# Patient Record
Sex: Male | Born: 1960 | Race: White | Hispanic: No | Marital: Married | Smoking: Former smoker
Health system: Southern US, Community
[De-identification: ages and names within clinical notes are randomized; demographics above are authoritative.]

## PROBLEM LIST (undated history)

## (undated) DIAGNOSIS — K769 Liver disease, unspecified: Secondary | ICD-10-CM

## (undated) HISTORY — DX: Liver disease, unspecified: K76.9

## (undated) HISTORY — PX: SHOULDER SURGERY: SHX246

---

## 2014-03-17 ENCOUNTER — Encounter: Payer: Self-pay | Admitting: Emergency Medicine

## 2014-03-17 ENCOUNTER — Emergency Department
Admission: EM | Admit: 2014-03-17 | Discharge: 2014-03-17 | Disposition: A | Discharge status: Home / Self Care | Payer: BC Managed Care – PPO | Source: Home / Self Care | Attending: Family Medicine | Admitting: Family Medicine

## 2014-03-17 DIAGNOSIS — J209 Acute bronchitis, unspecified: Secondary | ICD-10-CM

## 2014-03-17 MED ORDER — AZITHROMYCIN 250 MG PO TABS: ORAL_TABLET | ORAL | Status: DC

## 2014-03-17 MED ORDER — BENZONATATE 200 MG PO CAPS: 200.0000 mg | ORAL_CAPSULE | Freq: Every day | ORAL | Status: DC

## 2014-03-17 NOTE — ED Notes (Signed)
Reports 3 day history of cough, congestion and body aches. Took tylenol at 1745.

## 2014-03-17 NOTE — Discharge Instructions (Signed)
Take plain Mucinex (1200 mg guaifenesin) twice daily for cough and congestion.  May add Sudafed for sinus congestion.   Increase fluid intake, rest. May use Afrin nasal spray (or generic oxymetazoline) twice daily for about 5 days.  Also recommend using saline nasal spray several times daily and saline nasal irrigation (AYR is a common brand) Try warm salt water gargles for sore throat.  Stop all antihistamines for now, and other non-prescription cough/cold preparations. Follow-up with family doctor if not improving about one week

## 2014-03-17 NOTE — ED Provider Notes (Signed)
CSN: [id number]     Arrival date & time 03/17/14  1841 History   First MD Initiated Contact with Patient 03/17/14 1941     Chief Complaint  Patient presents with  . Cough  . Nasal Congestion  . Generalized Body Aches      HPI Comments: Patient complains of onset of a nonproductive cough three days ago, worse at night.  He developed fatigue and mild myalgias, with minimal sore throat and nasal congestion.  He had low grade fever this morning.  He is not sure about his last Tdap.  The history is provided by the patient.    History reviewed. No pertinent past medical history. Past Surgical History  Procedure Laterality Date  . Shoulder surgery Left    Family History  Problem Relation Age of Onset  . Diabetes Father   . Stroke Father    History  Substance Use Topics  . Smoking status: Never Smoker   . Smokeless tobacco: Not on file  . Alcohol Use: Yes    Review of Systems Minimal sore throat + cough No pleuritic pain No wheezing Minimal nasal congestion No post-nasal drainage No sinus pain/pressure No itchy/red eyes No earache No hemoptysis No SOB + low grade fever, + chills No nausea No vomiting No abdominal pain No diarrhea No urinary symptoms No skin rash + fatigue + myalgias No headache Used OTC meds without relief  Allergies  Review of patient's allergies indicates no known allergies.  Home Medications   Prior to Admission medications   Medication Sig Start Date End Date Taking? Authorizing Provider  azithromycin (ZITHROMAX Z-PAK) 250 MG tablet Take 2 tabs today; then begin one tab once daily for 4 more days. 03/17/14   Lattie HawStephen A Beese, MD  benzonatate (TESSALON) 200 MG capsule Take 1 capsule (200 mg total) by mouth at bedtime. Take as needed for cough 03/17/14   Lattie HawStephen A Beese, MD   BP 126/75 mmHg  Pulse 92  Temp(Src) 98.5 F (36.9 C) (Oral)  Resp 16  SpO2 97% Physical Exam Nursing notes and Vital Signs reviewed. Appearance:  Patient appears  healthy, stated age, and in no acute distress Eyes:  Pupils are equal, round, and reactive to light and accomodation.  Extraocular movement is intact.  Conjunctivae are not inflamed  Ears:  Canals normal.  Tympanic membranes normal.  Nose:  Mildly congested turbinates.  No sinus tenderness.   Pharynx:  Normal Neck:  Supple.  Tender enlarged posterior nodes are palpated bilaterally  Lungs:   Rhonchi heard left posterior base.  Breath sounds are equal.  Heart:  Regular rate and rhythm without murmurs, rubs, or gallops.  Abdomen:  Nontender without masses or hepatosplenomegaly.  Bowel sounds are present.  No CVA or flank tenderness.  Extremities:  No edema.  No calf tenderness Skin:  No rash present.   ED Course  Procedures  none  MDM   1. Acute bronchitis, unspecified organism    Begin Z-pack for atypical coverage.  Prescription written for Benzonatate [facility](Tessalon) to take at bedtime for night-time cough.  Take plain Mucinex (1200 mg guaifenesin) twice daily for cough and congestion.  May add Sudafed for sinus congestion.   Increase fluid intake, rest. May use Afrin nasal spray (or generic oxymetazoline) twice daily for about 5 days.  Also recommend using saline nasal spray several times daily and saline nasal irrigation (AYR is a common brand) Try warm salt water gargles for sore throat.  Stop all antihistamines for now, and other non-prescription  cough/cold preparations. Follow-up with family doctor if not improving about one week    Lattie HawStephen A Beese, MD 03/19/14 [phone]

## 2014-03-20 ENCOUNTER — Telehealth: Payer: Self-pay | Admitting: Emergency Medicine

## 2014-03-20 NOTE — ED Notes (Signed)
Inquired about patient's status; encourage them to call with questions/concerns.  

## 2015-04-08 DIAGNOSIS — K769 Liver disease, unspecified: Secondary | ICD-10-CM

## 2015-04-08 HISTORY — DX: Liver disease, unspecified: K76.9

## 2015-07-13 ENCOUNTER — Other Ambulatory Visit: Payer: Self-pay | Admitting: Family Medicine

## 2015-07-13 DIAGNOSIS — R7989 Other specified abnormal findings of blood chemistry: Secondary | ICD-10-CM

## 2015-07-13 DIAGNOSIS — R945 Abnormal results of liver function studies: Principal | ICD-10-CM

## 2015-07-16 ENCOUNTER — Ambulatory Visit (INDEPENDENT_AMBULATORY_CARE_PROVIDER_SITE_OTHER): Payer: BLUE CROSS/BLUE SHIELD

## 2015-07-16 DIAGNOSIS — R945 Abnormal results of liver function studies: Principal | ICD-10-CM

## 2015-07-16 DIAGNOSIS — R7989 Other specified abnormal findings of blood chemistry: Secondary | ICD-10-CM | POA: Diagnosis not present

## 2015-07-18 ENCOUNTER — Other Ambulatory Visit: Payer: Self-pay | Admitting: Family Medicine

## 2015-07-18 DIAGNOSIS — R7989 Other specified abnormal findings of blood chemistry: Secondary | ICD-10-CM

## 2015-07-18 DIAGNOSIS — R945 Abnormal results of liver function studies: Principal | ICD-10-CM

## 2015-07-23 ENCOUNTER — Ambulatory Visit (INDEPENDENT_AMBULATORY_CARE_PROVIDER_SITE_OTHER): Payer: BLUE CROSS/BLUE SHIELD

## 2015-07-23 DIAGNOSIS — R7989 Other specified abnormal findings of blood chemistry: Secondary | ICD-10-CM | POA: Diagnosis not present

## 2015-07-23 DIAGNOSIS — R945 Abnormal results of liver function studies: Principal | ICD-10-CM

## 2015-07-23 MED ORDER — GADOBENATE DIMEGLUMINE 529 MG/ML IV SOLN
20.0000 mL | Freq: Once | INTRAVENOUS | Status: AC | PRN
  Administered 2015-07-23: 16 mL via INTRAVENOUS

## 2018-01-02 DIAGNOSIS — Z23 Encounter for immunization: Secondary | ICD-10-CM | POA: Diagnosis not present

## 2018-01-08 DIAGNOSIS — R410 Disorientation, unspecified: Secondary | ICD-10-CM | POA: Diagnosis not present

## 2018-01-08 DIAGNOSIS — F99 Mental disorder, not otherwise specified: Secondary | ICD-10-CM | POA: Diagnosis not present

## 2018-01-11 ENCOUNTER — Other Ambulatory Visit: Payer: Self-pay | Admitting: Physician Assistant

## 2018-01-11 ENCOUNTER — Other Ambulatory Visit: Payer: BLUE CROSS/BLUE SHIELD

## 2018-01-11 ENCOUNTER — Ambulatory Visit (INDEPENDENT_AMBULATORY_CARE_PROVIDER_SITE_OTHER): Payer: BLUE CROSS/BLUE SHIELD

## 2018-01-11 DIAGNOSIS — G319 Degenerative disease of nervous system, unspecified: Secondary | ICD-10-CM | POA: Diagnosis not present

## 2018-01-11 DIAGNOSIS — J322 Chronic ethmoidal sinusitis: Secondary | ICD-10-CM

## 2018-01-11 DIAGNOSIS — F99 Mental disorder, not otherwise specified: Secondary | ICD-10-CM

## 2018-01-11 DIAGNOSIS — Z8659 Personal history of other mental and behavioral disorders: Secondary | ICD-10-CM | POA: Diagnosis not present

## 2018-01-28 ENCOUNTER — Ambulatory Visit: Payer: BLUE CROSS/BLUE SHIELD | Admitting: Neurology

## 2018-01-28 ENCOUNTER — Telehealth: Payer: Self-pay | Admitting: Neurology

## 2018-01-28 ENCOUNTER — Encounter: Payer: Self-pay | Admitting: Neurology

## 2018-01-28 VITALS — BP 132/85 | HR 77 | Ht 69.0 in | Wt 215.0 lb

## 2018-01-28 DIAGNOSIS — F0391 Unspecified dementia with behavioral disturbance: Secondary | ICD-10-CM | POA: Diagnosis not present

## 2018-01-28 DIAGNOSIS — R419 Unspecified symptoms and signs involving cognitive functions and awareness: Secondary | ICD-10-CM | POA: Diagnosis not present

## 2018-01-28 DIAGNOSIS — F0390 Unspecified dementia without behavioral disturbance: Secondary | ICD-10-CM | POA: Diagnosis not present

## 2018-01-28 DIAGNOSIS — G9340 Encephalopathy, unspecified: Secondary | ICD-10-CM

## 2018-01-28 DIAGNOSIS — E539 Vitamin B deficiency, unspecified: Secondary | ICD-10-CM | POA: Diagnosis not present

## 2018-01-28 DIAGNOSIS — E519 Thiamine deficiency, unspecified: Secondary | ICD-10-CM | POA: Diagnosis not present

## 2018-01-28 NOTE — Patient Instructions (Addendum)
Labwork MRI brain w/wo contrast [facility]) FDG PET Scan [facility]ore Long for dementia FTD vs Alzheimers) Formal neurocognitive testing, contact physician at [facility] No Driving

## 2018-01-28 NOTE — Telephone Encounter (Signed)
Pt was called by front staff.

## 2018-01-28 NOTE — Progress Notes (Signed)
[facility]    Provider:  Dr Logan Nichols Referring Provider: Joycelyn Rua, MD, Logan Nichols Primary Care Physician:  Logan Rua, MD  CC:  Cognitive changes  HPI:  Logan Nichols is a 57 y.o. male here as requested by Dr. Lenise Nichols for memory loss.  Past medical history obesity, prediabetes, IBS.  Judgment and insight appeared normal. 2 years ago but he had an issue with augmentin and became jaundiced with hepatitis, this caused a hospitalization and afterwards he had psychiatric issues, insomnia, depression, out of work for several months, started April 1st 2017. That took an emotional toll and physical toll. Wife and son provides most information today. Never went back to baseline afterwards. In the past 4-5 months there has been personality problems, memory loss, changes in concentration, not remembering conversations, asking the same questions over and over again in the same day, a lot of short term memory, also long-term memory, have lived in the same house for 30 years and can't remember how to get to places he has been to multiple times. No tremors, no focal weakness, no facial droop, he has difficulty speaking and finding words, forgets and repeats words and sentences, jumbling words. Not motivated possible apathy, he was given a list of tasks the other day and did not do many of then any of them right, inability to plan and execute. They deny extensive alcohol or drug use or mood disorders. No other focal neurologic deficits, associated symptoms, inciting events or modifiable factors.  Reviewed notes, labs and imaging from outside physicians, which showed:  Hemoglobin A1c 5.4, CMP with BUN 11 and creatinine 1.08 (these labs were taken January 12, 2018), otherwise CMP unremarkable, sed rate normal 7, TSH normal 1.4.ANA negative, CRP 2 which is normal.  CT of the head 01/11/2018: personally reviewed imaging and agree with the following:  Mild diffuse atrophy. Brain  parenchyma appears unremarkable. No acute infarct. No mass or hemorrhage.  Reviewed Dr. Izola Nichols notes, she is concerned over memory loss, does not suspect alcohol or drugs as cause.  Head CT was ordered.  MRI/MRA was ordered and sent to neurology.  He has had a 4 to 14-month change in cognition and memory.  Presented with his wife and HR director from work.  The HR director for [employer] had a list of instances where Logan Nichols seemed like something was wrong.  States he will ask about a project several times as if it were the first time.  He forgot his log and username for Crohn O's and was unable to figure out how to open [employer].  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  She has had concerns mother plays about his behavior as well.  States that the changes in his demeanor seems slow but have definitely been worsening.  His mood has changed at home 2.  She finds her self reminding him of things like a hospital visit that occurred from a reaction to Augmentin last year, he stated he was out of work for 8 months but she said it was only 3 months.  Patient stays mostly quiet.  He still performs his ADLs. No hallucinations, no delusions. Discussed no driving.   Review of Systems: Patient complains of symptoms per HPI as well as the following symptoms: memory loss, personality changes, confusion. Pertinent negatives and positives per HPI. All others negative.   Social History   Socioeconomic History  . Marital status: Married    Spouse name: Not on  file  . Number of children: 1  . Years of education: 41  . Highest education level: Bachelor's degree (e.g., BA, AB, BS)  Occupational History  . Not on file  Social Needs  . Financial resource strain: Not on file  . Food insecurity:    Worry: Not on file    Inability: Not on file  . Transportation needs:    Medical: Not on file    Non-medical: Not on file  Tobacco Use  . Smoking status: Former Smoker    Types: Cigarettes     Last attempt to quit: 1989    Years since quitting: 30.8  . Smokeless tobacco: Never Used  Substance and Sexual Activity  . Alcohol use: Yes    Comment: occasionally  . Drug use: Never  . Sexual activity: Not on file  Lifestyle  . Physical activity:    Days per week: Not on file    Minutes per session: Not on file  . Stress: Not on file  Relationships  . Social connections:    Talks on phone: Not on file    Gets together: Not on file    Attends religious service: Not on file    Active member of club or organization: Not on file    Attends meetings of clubs or organizations: Not on file    Relationship status: Not on file  . Intimate partner violence:    Fear of current or ex partner: Not on file    Emotionally abused: Not on file    Physically abused: Not on file    Forced sexual activity: Not on file  Other Topics Concern  . Not on file  Social History Narrative   Lives at home with spouse    Family History  Problem Relation Age of Onset  . Diabetes Father   . Stroke Father   . Cataracts Father   . CAD Father   . Other Father        "plaque issue"  . Lupus Mother     Past Medical History:  Diagnosis Date  . 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 SHOULDER SURGERY Left     No current outpatient medications on file.   No current facility-administered medications for this visit.     Allergies as of 01/28/2018 - Review Complete 01/28/2018  Allergen Reaction Noted  . Amoxicillin-pot clavulanate  08/02/2015  . Other  01/28/2018    Vitals: BP 132/85 (BP Location: Right Arm, Patient Position: Sitting)   Pulse 77   Ht 5\' 9"  (1.753 m)   Wt 215 lb (97.5 kg)   BMI 31.75 kg/m  Last Weight:  Wt Readings from Last 1 Encounters:  01/28/18 215 lb (97.5 kg)   Last Height:   Ht Readings from Last 1 Encounters:  01/28/18 5\' 9"  (1.753 m)    Physical exam: Exam: Gen: flat affect, not conversant                  CV: RRR, no MRG. No Carotid Bruits. No peripheral edema, warm, nontender Eyes: Conjunctivae clear without exudates or hemorrhage  Neuro: Detailed Neurologic Exam  Speech:    Speech is without aphasia Cognition:  MMSE - Mini Mental State Exam 01/28/2018  Orientation to time 2  Orientation to Place 4  Registration 3  Attention/ Calculation 1  Recall 1  Language- name 2 objects 2  Language- repeat 0  Language- follow  3 step command 3  Language- read & follow direction 1  Write a sentence 0  Copy design 0  Total score 17    Cranial Nerves:    The pupils are equal, round, and reactive to light. Attempted fundoscopy could not visualize.. Visual fields are full to finger confrontation. Extraocular movements are intact. Trigeminal sensation is intact and the muscles of mastication are normal. The face is symmetric. The palate elevates in the midline. Hearing intact. Voice is normal. Shoulder shrug is normal. The tongue has normal motion without fasciculations.   Coordination:    No dysmetria noted  Gait:    Heel-toe and tandem gait are normal. Slightly stooped.   Motor Observation:    No asymmetry, no atrophy, and no involuntary movements noted. Tone:    Normal muscle tone.    Posture:    Posture is normal. normal erect    Strength:    Strength is V/V in the upper and lower limbs.      Sensation: intact to LT     Reflex Exam:  DTR's:    Deep tendon reflexes in the upper and lower extremities are brisk bilaterally.   Toes:    The toes are equiv bilaterally.   Clonus:    Clonus is present in the patellars, crossed adductors.    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 In the last 4-5 months progressive memory loss, more short term memory loss, changes in personality and concentration,  difficultly performing tasks, getting lost in the car, decreased motivation, inability to plan and execute. Very concerning for Frontotemporal dementia however cannot rule out other dementias such as Alzheimer's early onset.  +upper motor neuron signs.   Extensive Labwork today. Consider LP in future. MRI brain w/wo contrast [facility]) to eval for other causes of dementia FDG PET Scan Ger[facility] Spore Long for dementia FTD vs Alzheimers) Formal neurocognitive testing, contact physician at [facility] No Driving and discussed safety precautions  Orders Placed This Encounter  Procedures  . Culture, Urine  . MR BRAIN W WO CONTRAST  . NM PET Metabolic Brain  . Ammonia  . ANA  . ANA, IFA (with reflex)  . Comprehensive Drug Analysis,Ur  . Urinalysis, Routine w reflex microscopic  . CBC  . Comprehensive metabolic panel  . Homocysteine  . Heavy metals, blood  . B. burgdorfi Antibody  . RPR  . HIV Antibody (routine testing w rflx)  . Magnesium  . Phosphorus  . Sedimentation rate  . C-reactive protein  . TSH  . T4, Free  . Vitamin B6  . Vitamin B1  . B12 and Folate Panel  . Methylmalonic acid, serum  . PTH, Intact and Calcium  . Thyroglobulin antibody  . Thyroid peroxidase antibody  . Ambulatory referral to Neuropsychology     Cc: Logan Rua, MD, Logan Nichols    Naomie Dean, MD  [facility] [address] [place], [place] [address]  Phone [phone] Fax [phone]

## 2018-01-28 NOTE — Telephone Encounter (Signed)
Called pt. And spoke with wife. Scheduled appt time that would better fit their schedule.

## 2018-01-28 NOTE — Telephone Encounter (Signed)
Pt wife(Mccorvey,Beverly-not showing to be on a DPR yet ) has called stating she got a call from RN Toma Copier about a 3:30 office visit on 03-16-2018 pt wife is asking for a call back about it.  She declined the office visit for 12-11 with 8:00 check in

## 2018-01-29 ENCOUNTER — Encounter: Payer: Self-pay | Admitting: Neurology

## 2018-01-29 DIAGNOSIS — F0390 Unspecified dementia without behavioral disturbance: Secondary | ICD-10-CM | POA: Insufficient documentation

## 2018-01-30 LAB — URINE CULTURE: Organism ID, Bacteria: NO GROWTH

## 2018-02-01 LAB — COMPREHENSIVE DRUG ANALYSIS,UR

## 2018-02-02 ENCOUNTER — Telehealth: Payer: Self-pay | Admitting: *Deleted

## 2018-02-02 NOTE — Telephone Encounter (Signed)
Pts wife [name] returning RNs call

## 2018-02-02 NOTE — Telephone Encounter (Signed)
Spoke with pt's wife [name] (on [place]) and discussed that so far labs are negative, some pending. We will call if those are abnormal. She verbalized understanding and appreciation. She asked about the status on the authorization for the MRI and PET scans. RN advised that it does not look like the process is complete but will send message to MRI coordinator to check. She verbalized appreciation.

## 2018-02-02 NOTE — Telephone Encounter (Signed)
[employer] Auth: [id number] (exp. 02/02/18 to 03/03/18) faxed order to [facility] med center they will reach out to the patient to schedule.

## 2018-02-02 NOTE — Telephone Encounter (Signed)
-----   Message from Anson Fret, MD sent at 01/31/2018  2:44 PM EDT ----- Labs all negative so far. Still pending several will call if abnormal.

## 2018-02-02 NOTE — Telephone Encounter (Signed)
Thank you so much

## 2018-02-03 ENCOUNTER — Telehealth: Payer: Self-pay | Admitting: Neurology

## 2018-02-03 LAB — COMPREHENSIVE METABOLIC PANEL
A/G RATIO: 1.7 (ref 1.2–2.2)
ALBUMIN: 4.7 g/dL (ref 3.5–5.5)
ALK PHOS: 62 IU/L (ref 39–117)
ALT: 16 IU/L (ref 0–44)
AST: 17 IU/L (ref 0–40)
BUN / CREAT RATIO: 11 (ref 9–20)
BUN: 12 mg/dL (ref 6–24)
Bilirubin Total: 0.6 mg/dL (ref 0.0–1.2)
CALCIUM: 10 mg/dL (ref 8.7–10.2)
CO2: 24 mmol/L (ref 20–29)
Chloride: 105 mmol/L (ref 96–106)
Creatinine, Ser: 1.11 mg/dL (ref 0.76–1.27)
GFR calc Af Amer: 85 mL/min/{1.73_m2} (ref >59)
GFR calc non Af Amer: 74 mL/min/{1.73_m2} (ref >59)
Globulin, Total: 2.7 g/dL (ref 1.5–4.5)
Glucose: 90 mg/dL (ref 65–99)
POTASSIUM: 4.8 mmol/L (ref 3.5–5.2)
Sodium: 145 mmol/L — ABNORMAL HIGH (ref 134–144)
Total Protein: 7.4 g/dL (ref 6.0–8.5)

## 2018-02-03 LAB — URINALYSIS, ROUTINE W REFLEX MICROSCOPIC
Bilirubin, UA: NEGATIVE
Glucose, UA: NEGATIVE
Ketones, UA: NEGATIVE
LEUKOCYTES UA: NEGATIVE
Nitrite, UA: NEGATIVE
Protein, UA: NEGATIVE
RBC, UA: NEGATIVE
Specific Gravity, UA: 1.009 (ref 1.005–1.030)
Urobilinogen, Ur: 0.2 mg/dL (ref 0.2–1.0)
pH, UA: 7 (ref 5.0–7.5)

## 2018-02-03 LAB — CBC
HEMATOCRIT: 45.1 % (ref 37.5–51.0)
Hemoglobin: 15.2 g/dL (ref 13.0–17.7)
MCH: 29.7 pg (ref 26.6–33.0)
MCHC: 33.7 g/dL (ref 31.5–35.7)
MCV: 88 fL (ref 79–97)
Platelets: 255 10*3/uL (ref 150–450)
RBC: 5.12 x10E6/uL (ref 4.14–5.80)
RDW: 12.9 % (ref 12.3–15.4)
WBC: 7.3 10*3/uL (ref 3.4–10.8)

## 2018-02-03 LAB — SEDIMENTATION RATE: SED RATE: 2 mm/h (ref 0–30)

## 2018-02-03 LAB — MAGNESIUM: MAGNESIUM: 2.1 mg/dL (ref 1.6–2.3)

## 2018-02-03 LAB — PHOSPHORUS: Phosphorus: 3.7 mg/dL (ref 2.5–4.5)

## 2018-02-03 LAB — B12 AND FOLATE PANEL
Folate: 15.1 ng/mL (ref >3.0)
VITAMIN B 12: 662 pg/mL (ref 232–1245)

## 2018-02-03 LAB — PTH, INTACT AND CALCIUM: PTH: 15 pg/mL (ref 15–65)

## 2018-02-03 LAB — TSH: TSH: 1.03 u[IU]/mL (ref 0.450–4.500)

## 2018-02-03 LAB — METHYLMALONIC ACID, SERUM: Methylmalonic Acid: 180 nmol/L (ref 0–378)

## 2018-02-03 LAB — THYROID PEROXIDASE ANTIBODY: THYROID PEROXIDASE ANTIBODY: 20 [IU]/mL (ref 0–34)

## 2018-02-03 LAB — ANA: ANA TITER 1: NEGATIVE

## 2018-02-03 LAB — T4, FREE: Free T4: 1.35 ng/dL (ref 0.82–1.77)

## 2018-02-03 LAB — RPR: RPR: NONREACTIVE

## 2018-02-03 LAB — THYROGLOBULIN ANTIBODY

## 2018-02-03 LAB — VITAMIN B1: THIAMINE: 133.4 nmol/L (ref 66.5–200.0)

## 2018-02-03 LAB — HEAVY METALS, BLOOD
Arsenic: 5 ug/L (ref 2–23)
Lead, Blood: NOT DETECTED ug/dL (ref 0–4)
Mercury: NOT DETECTED ug/L (ref 0.0–14.9)

## 2018-02-03 LAB — AMMONIA: AMMONIA: 29 ug/dL (ref 27–102)

## 2018-02-03 LAB — C-REACTIVE PROTEIN: CRP: 1 mg/L (ref 0–10)

## 2018-02-03 LAB — HIV ANTIBODY (ROUTINE TESTING W REFLEX): HIV Screen 4th Generation wRfx: NONREACTIVE

## 2018-02-03 LAB — VITAMIN B6

## 2018-02-03 LAB — HOMOCYSTEINE: Homocysteine: 13.5 umol/L (ref 0.0–15.0)

## 2018-02-03 LAB — B. BURGDORFI ANTIBODIES: Lyme IgG/IgM Ab: <0.91 {ISR} (ref 0.00–0.90)

## 2018-02-03 NOTE — Telephone Encounter (Signed)
Noted thank you

## 2018-02-03 NOTE — Telephone Encounter (Signed)
Per Dr. Lucia Gaskins, wrong name was given. It is supposed to be Logan Nichols. Lajean Silvius, MD, MPH Assistant Professor of Neurology Cognitive and Behavioral Neurology. Dr. Lucia Gaskins already talked to [name] this morning and they will have the referral sent to Dr. Lajean Silvius.  Called pt's wife [name] and LVM asking for call back. Left office number in message. When she calls back, please give her the message above from Dr. Lucia Gaskins.

## 2018-02-03 NOTE — Telephone Encounter (Signed)
Pt wife(on DPR-[place],Beverly) has asked for a call back re: suggested referral to see a Scarlett Presto.  Please call

## 2018-02-03 NOTE — Telephone Encounter (Signed)
Called pt & pt's wife on home # and LVM (ok per DPR) informing them that [facility] med center will be calling them to schedule the MRI. Our office will not be scheduling the MRI. Left office number in case they have any questions.

## 2018-02-04 NOTE — Telephone Encounter (Signed)
Spoke with pt's wife [place]. Advised her of the information from the referral:   Patient will see Dr. Vaughan Basta Dr. Rennie Plowman is booked until March of 2020 Telephone [phone] fax [phone] -- Jan 3 rd arrive at 8:30 for 9:00 am .   She verbalized understanding and appreciation for the update. She was hoping pt could be seen sooner since he is out of work for 2 months. She will call their office to see if this is possible.

## 2018-02-04 NOTE — Telephone Encounter (Signed)
[facility] is scheduled at [facility] [place] with [facility] for 02/15/18.

## 2018-02-11 ENCOUNTER — Ambulatory Visit (HOSPITAL_COMMUNITY)
Admission: RE | Admit: 2018-02-11 | Discharge: 2018-02-11 | Disposition: A | Discharge status: Home / Self Care | Payer: BLUE CROSS/BLUE SHIELD | Source: Ambulatory Visit | Attending: Neurology | Admitting: Neurology

## 2018-02-11 DIAGNOSIS — F0391 Unspecified dementia with behavioral disturbance: Secondary | ICD-10-CM | POA: Insufficient documentation

## 2018-02-11 DIAGNOSIS — F0390 Unspecified dementia without behavioral disturbance: Secondary | ICD-10-CM | POA: Insufficient documentation

## 2018-02-11 DIAGNOSIS — G9340 Encephalopathy, unspecified: Secondary | ICD-10-CM

## 2018-02-11 DIAGNOSIS — R4182 Altered mental status, unspecified: Secondary | ICD-10-CM | POA: Diagnosis not present

## 2018-02-11 LAB — GLUCOSE, CAPILLARY: Glucose-Capillary: 92 mg/dL (ref 70–99)

## 2018-02-11 MED ORDER — FLUDEOXYGLUCOSE F - 18 (FDG) INJECTION
10.1600 | Freq: Once | INTRAVENOUS | Status: AC
  Administered 2018-02-11: 10.16 via INTRAVENOUS

## 2018-02-12 ENCOUNTER — Telehealth: Payer: Self-pay | Admitting: *Deleted

## 2018-02-12 NOTE — Telephone Encounter (Signed)
-----   Message from Anson Fret, MD sent at 02/11/2018  7:44 PM EST ----- FDg PET Scan is normal, no indication of Alzheimers or frontotemporal dementia. However the patient could still have these as the test is not 100% acurate. The next step is to get him to [facility] to the memory unit we discussed. Thanks "No decreased relative cortical metabolism to suggest frontotemporal dementia or Alzheimer's type pathology."

## 2018-02-12 NOTE — Telephone Encounter (Signed)
Called pt's wife [name] (on [place]) and LVM asking for call back. Left office number and hours in message.

## 2018-02-12 NOTE — Telephone Encounter (Signed)
Spoke with [name] (pt's wife) and she stated that since Dr. Lajean Silvius was booked until the end of March, they scheudled pt with Chauncey Cruel off [address] and pt's appt is next Tuesday at 1:30 pm. She thinks he might be general neurology and not one who does the formal memory testing. She said they didn't schedule pt with Dr. Lajean Silvius. She asked if all of the lab results, FDG pet scan, and CT results could be sent to [facility] office @ fax # [phone]. RN advised that she would send message to referrals and Dr. Lucia Gaskins to let them know.  RN reviewed FDG pet scan results and that pt may still have these conditions but we need him to see [facility] as planned. She verbalized understanding and appreciation.

## 2018-02-12 NOTE — Telephone Encounter (Signed)
I don;t know who that is but if it is a doctor at [facility] I would see them and then they can try to talk to [facility] and get him moved up more quickly. thanks

## 2018-02-12 NOTE — Telephone Encounter (Signed)
Called pt's wife [name] and LVM asking for call back.

## 2018-02-15 ENCOUNTER — Ambulatory Visit (INDEPENDENT_AMBULATORY_CARE_PROVIDER_SITE_OTHER): Payer: BLUE CROSS/BLUE SHIELD

## 2018-02-15 DIAGNOSIS — F0390 Unspecified dementia without behavioral disturbance: Secondary | ICD-10-CM

## 2018-02-15 DIAGNOSIS — R410 Disorientation, unspecified: Secondary | ICD-10-CM

## 2018-02-15 DIAGNOSIS — G9340 Encephalopathy, unspecified: Secondary | ICD-10-CM

## 2018-02-15 DIAGNOSIS — R479 Unspecified speech disturbances: Secondary | ICD-10-CM

## 2018-02-15 DIAGNOSIS — F0391 Unspecified dementia with behavioral disturbance: Secondary | ICD-10-CM

## 2018-02-15 MED ORDER — GADOBUTROL 1 MMOL/ML IV SOLN
10.0000 mL | Freq: Once | INTRAVENOUS | Status: AC | PRN
  Administered 2018-02-15: 10 mL via INTRAVENOUS

## 2018-02-15 NOTE — Telephone Encounter (Signed)
Noted thank you

## 2018-02-15 NOTE — Telephone Encounter (Signed)
Called and spoke to Patient's wife and she relayed they set up apt.  Dr. Gerarda Gunther. Marcello Moores, MD  - [phone] - fax [phone] . I have faxed all records and she will take MRI CD as well .  Patient is going to Keep his apt apt with [facility] as well.

## 2018-02-16 ENCOUNTER — Telehealth: Payer: Self-pay | Admitting: Neurology

## 2018-02-16 DIAGNOSIS — R413 Other amnesia: Secondary | ICD-10-CM | POA: Diagnosis not present

## 2018-02-16 DIAGNOSIS — R404 Transient alteration of awareness: Secondary | ICD-10-CM | POA: Diagnosis not present

## 2018-02-16 NOTE — Telephone Encounter (Signed)
Patient's wife [name] (on [place]) states patient had an MRI yesterday. She wants to know if we would have results today because he has a 1:30pm appointment today with Dr. Chauncey Cruel and would like results sent to him.

## 2018-02-16 NOTE — Telephone Encounter (Signed)
MRI of the brain is unremarkable for age. If they agree I would like to do a DAT scan to look for Lewy Body Dementia. But since they have an appointment soon with the neuropsychologist we can await and see their results first before more testing. Let me know how they feel thanks.

## 2018-02-16 NOTE — Telephone Encounter (Signed)
MRI was unremarkable. Please fax results. I'd like to order another scan a DAT scan to evaluate for lewy body dementia. Please get the name of the neuropsych specialist and nuber so I can call him after they see him thanks

## 2018-02-17 NOTE — Telephone Encounter (Signed)
Called wife Meriam SpragueBeverly on her mobile #. LVM asking for call back when she can. Left office number in message.

## 2018-02-17 NOTE — Telephone Encounter (Signed)
Reached out to wife. Pt answered. Wife is at work. Will try her mobile number today.

## 2018-02-17 NOTE — Telephone Encounter (Addendum)
Plan from Dr. Rinaldo RatelMitchell Isaac's consult note on 02/16/18:  Plan   He presents with rapidly progressive memory loss which is certainly concerning for a neurodegenerative process. I will get an EEG to rule out seizures, metabolic induced slowing or even periodic complexes to suggest CJD. I will send off a vitamin B1 as this has not been done and also a full paraneoplastic autoantibody evaluation. I will try to get a stat referral for neuropsych testing to help better characterize his memory loss. We did discuss a spinal tap. If we did this I would have to try to work him in anyway so we will plan to be in touch after his initial test results. Should they come back unrevealing then a spinal tap would likely be indicated. In that case I would work to get him in quickly.   RN noted that pt has an upcoming appointment with Dr. Vaughan BastaBenjamin Williams on 04/09/2018.

## 2018-02-17 NOTE — Telephone Encounter (Signed)
Will address this in other phone note labeled results.

## 2018-02-17 NOTE — Telephone Encounter (Signed)
Received from Dr. Lucia GaskinsAhern:  MRI of the brain is unremarkable for age. If they agree I would like to do a DAT scan to look for Lewy Body Dementia. But since they have an appointment soon with the neuropsychologist we can await and see their results first before more testing. Let me know how they feel thanks.

## 2018-02-17 NOTE — Telephone Encounter (Signed)
It may take 4-6 weeks for the neuropsych testing report to be completed I'm not sure since Dr. Marcello MooresIsaac ordered it. Also after the report is complete they need to meet with the neuropsychologist or Dr. Marcello MooresIsaac to review the report, Cancel with me until they complete the process with Dr. Marcello MooresIsaac and they may want to stay with Dr. Adrian [facility]rowsIsaac [facility].

## 2018-02-17 NOTE — Telephone Encounter (Signed)
Meriam SpragueBeverly returned my call. She stated that Dr. Marcello MooresIsaac had told her the MRI brain really didn't show anything and RN confirmed and told her that Dr.Ahern said it was unremarkable for pt's age. Discussed that Dr. Lucia GaskinsAhern would like to do a DAT scan to look for Lewy Body Dementia and that if Meriam SpragueBeverly wants, she can wait until after the neuropsych testing since it's soon, but we would like to know how she feels. Meriam SpragueBeverly agreed and would like to wait. She [facility] Dr. Gennie AlmaGoeke at 10:00 AM on 03/16/18 and told her to cancel the appointment with Dr. Vaughan BastaBenjamin Williams that was scheduled for January. Pt also has a f/u with Dr. Lucia GaskinsAhern at 3:30 pm on 03/16/18 as well. Meriam SpragueBeverly asked if that is ok. RN informed her that she would let Dr. Lucia GaskinsAhern know and if anything needs to change we will call her back. She verbalized appreciation. She will cancel appt with Dr. Mayford KnifeWilliams as wake advised.

## 2018-02-18 NOTE — Telephone Encounter (Signed)
Called pt's wife & LVM asking for call back. Left office number in message.

## 2018-02-18 NOTE — Telephone Encounter (Signed)
Pt wife(on DPR) has returned call to [employer], she s asking for a a call back either before 3:30 or after 4:30

## 2018-02-18 NOTE — Telephone Encounter (Signed)
Spoke with pt's wife FritchBeverly. Discussed advice from Dr. Lucia GaskinsAhern regarding completing the full neuropsych testing first as this could take 4-6 weeks and f/u with neuropsych or Dr. Marcello MooresIsaac to discuss results. They could even stay with Dr. Marcello MooresIsaac if they wanted afterward so we can continue to keep in touch and discuss later. Discussed canceling the 03/16/18 appt. Meriam SpragueBeverly was completely fine with this and verbalized appreciation. Her questions were answered.   03/16/18 appt with Dr. Lucia GaskinsAhern canceled.

## 2018-02-22 ENCOUNTER — Encounter

## 2018-02-22 ENCOUNTER — Ambulatory Visit: Payer: BLUE CROSS/BLUE SHIELD | Admitting: Neurology

## 2018-03-08 DIAGNOSIS — R413 Other amnesia: Secondary | ICD-10-CM | POA: Diagnosis not present

## 2018-03-09 DIAGNOSIS — R413 Other amnesia: Secondary | ICD-10-CM | POA: Diagnosis not present

## 2018-03-16 ENCOUNTER — Ambulatory Visit: Payer: BLUE CROSS/BLUE SHIELD | Admitting: Neurology

## 2018-03-16 DIAGNOSIS — R4189 Other symptoms and signs involving cognitive functions and awareness: Secondary | ICD-10-CM | POA: Diagnosis not present

## 2018-03-25 ENCOUNTER — Ambulatory Visit: Payer: BLUE CROSS/BLUE SHIELD | Admitting: Neurology

## 2018-04-14 DIAGNOSIS — F0391 Unspecified dementia with behavioral disturbance: Secondary | ICD-10-CM | POA: Diagnosis not present

## 2018-04-17 DIAGNOSIS — R358 Other polyuria: Secondary | ICD-10-CM | POA: Diagnosis not present

## 2018-04-17 DIAGNOSIS — R350 Frequency of micturition: Secondary | ICD-10-CM | POA: Diagnosis not present

## 2018-04-21 DIAGNOSIS — K719 Toxic liver disease, unspecified: Secondary | ICD-10-CM | POA: Diagnosis not present

## 2018-04-21 DIAGNOSIS — R945 Abnormal results of liver function studies: Secondary | ICD-10-CM | POA: Diagnosis not present

## 2018-04-21 DIAGNOSIS — E669 Obesity, unspecified: Secondary | ICD-10-CM | POA: Diagnosis not present

## 2018-04-21 DIAGNOSIS — Z6830 Body mass index (BMI) 30.0-30.9, adult: Secondary | ICD-10-CM | POA: Diagnosis not present

## 2018-04-21 DIAGNOSIS — K7589 Other specified inflammatory liver diseases: Secondary | ICD-10-CM | POA: Diagnosis not present

## 2018-04-27 DIAGNOSIS — F0150 Vascular dementia without behavioral disturbance: Secondary | ICD-10-CM | POA: Diagnosis not present

## 2018-05-12 DIAGNOSIS — R110 Nausea: Secondary | ICD-10-CM | POA: Diagnosis not present

## 2018-05-12 DIAGNOSIS — R4182 Altered mental status, unspecified: Secondary | ICD-10-CM | POA: Diagnosis not present

## 2018-05-12 DIAGNOSIS — R55 Syncope and collapse: Secondary | ICD-10-CM | POA: Diagnosis not present

## 2018-05-12 DIAGNOSIS — R4020 Unspecified coma: Secondary | ICD-10-CM | POA: Diagnosis not present

## 2018-05-13 DIAGNOSIS — R55 Syncope and collapse: Secondary | ICD-10-CM | POA: Diagnosis not present

## 2018-05-17 DIAGNOSIS — Z0000 Encounter for general adult medical examination without abnormal findings: Secondary | ICD-10-CM | POA: Diagnosis not present

## 2018-05-17 DIAGNOSIS — R809 Proteinuria, unspecified: Secondary | ICD-10-CM | POA: Diagnosis not present

## 2018-05-24 DIAGNOSIS — K719 Toxic liver disease, unspecified: Secondary | ICD-10-CM | POA: Diagnosis not present

## 2018-05-24 DIAGNOSIS — F329 Major depressive disorder, single episode, unspecified: Secondary | ICD-10-CM | POA: Diagnosis not present

## 2018-05-24 DIAGNOSIS — F0390 Unspecified dementia without behavioral disturbance: Secondary | ICD-10-CM | POA: Diagnosis not present

## 2018-05-24 DIAGNOSIS — T368X5D Adverse effect of other systemic antibiotics, subsequent encounter: Secondary | ICD-10-CM | POA: Diagnosis not present

## 2018-05-24 DIAGNOSIS — G3183 Dementia with Lewy bodies: Secondary | ICD-10-CM | POA: Diagnosis not present

## 2018-07-19 DIAGNOSIS — H01009 Unspecified blepharitis unspecified eye, unspecified eyelid: Secondary | ICD-10-CM | POA: Diagnosis not present

## 2018-07-30 DIAGNOSIS — L249 Irritant contact dermatitis, unspecified cause: Secondary | ICD-10-CM | POA: Diagnosis not present

## 2018-08-24 DIAGNOSIS — R21 Rash and other nonspecific skin eruption: Secondary | ICD-10-CM | POA: Diagnosis not present

## 2018-08-24 DIAGNOSIS — L218 Other seborrheic dermatitis: Secondary | ICD-10-CM | POA: Diagnosis not present

## 2018-08-25 DIAGNOSIS — G3183 Dementia with Lewy bodies: Secondary | ICD-10-CM | POA: Diagnosis not present

## 2018-08-25 DIAGNOSIS — F0280 Dementia in other diseases classified elsewhere without behavioral disturbance: Secondary | ICD-10-CM | POA: Diagnosis not present

## 2018-09-01 DIAGNOSIS — F0280 Dementia in other diseases classified elsewhere without behavioral disturbance: Secondary | ICD-10-CM | POA: Diagnosis not present

## 2018-09-01 DIAGNOSIS — G3183 Dementia with Lewy bodies: Secondary | ICD-10-CM | POA: Diagnosis not present

## 2018-09-03 DIAGNOSIS — F0280 Dementia in other diseases classified elsewhere without behavioral disturbance: Secondary | ICD-10-CM | POA: Diagnosis not present

## 2018-09-03 DIAGNOSIS — G3183 Dementia with Lewy bodies: Secondary | ICD-10-CM | POA: Diagnosis not present

## 2018-09-06 DIAGNOSIS — F0280 Dementia in other diseases classified elsewhere without behavioral disturbance: Secondary | ICD-10-CM | POA: Diagnosis not present

## 2018-09-06 DIAGNOSIS — G3183 Dementia with Lewy bodies: Secondary | ICD-10-CM | POA: Diagnosis not present

## 2018-09-07 DIAGNOSIS — F0280 Dementia in other diseases classified elsewhere without behavioral disturbance: Secondary | ICD-10-CM | POA: Diagnosis not present

## 2018-09-07 DIAGNOSIS — G3183 Dementia with Lewy bodies: Secondary | ICD-10-CM | POA: Diagnosis not present

## 2018-09-08 DIAGNOSIS — F0280 Dementia in other diseases classified elsewhere without behavioral disturbance: Secondary | ICD-10-CM | POA: Diagnosis not present

## 2018-09-08 DIAGNOSIS — G3183 Dementia with Lewy bodies: Secondary | ICD-10-CM | POA: Diagnosis not present

## 2018-09-09 DIAGNOSIS — G3183 Dementia with Lewy bodies: Secondary | ICD-10-CM | POA: Diagnosis not present

## 2018-09-09 DIAGNOSIS — F0280 Dementia in other diseases classified elsewhere without behavioral disturbance: Secondary | ICD-10-CM | POA: Diagnosis not present

## 2018-09-13 DIAGNOSIS — G3183 Dementia with Lewy bodies: Secondary | ICD-10-CM | POA: Diagnosis not present

## 2018-09-13 DIAGNOSIS — F0280 Dementia in other diseases classified elsewhere without behavioral disturbance: Secondary | ICD-10-CM | POA: Diagnosis not present

## 2018-09-14 DIAGNOSIS — F0280 Dementia in other diseases classified elsewhere without behavioral disturbance: Secondary | ICD-10-CM | POA: Diagnosis not present

## 2018-09-14 DIAGNOSIS — G3183 Dementia with Lewy bodies: Secondary | ICD-10-CM | POA: Diagnosis not present

## 2018-09-15 DIAGNOSIS — F0280 Dementia in other diseases classified elsewhere without behavioral disturbance: Secondary | ICD-10-CM | POA: Diagnosis not present

## 2018-09-15 DIAGNOSIS — G3183 Dementia with Lewy bodies: Secondary | ICD-10-CM | POA: Diagnosis not present

## 2018-09-20 DIAGNOSIS — G3183 Dementia with Lewy bodies: Secondary | ICD-10-CM | POA: Diagnosis not present

## 2018-09-20 DIAGNOSIS — F0280 Dementia in other diseases classified elsewhere without behavioral disturbance: Secondary | ICD-10-CM | POA: Diagnosis not present

## 2018-09-21 DIAGNOSIS — G3183 Dementia with Lewy bodies: Secondary | ICD-10-CM | POA: Diagnosis not present

## 2018-09-21 DIAGNOSIS — F0280 Dementia in other diseases classified elsewhere without behavioral disturbance: Secondary | ICD-10-CM | POA: Diagnosis not present

## 2018-09-22 DIAGNOSIS — F0280 Dementia in other diseases classified elsewhere without behavioral disturbance: Secondary | ICD-10-CM | POA: Diagnosis not present

## 2018-09-22 DIAGNOSIS — G3183 Dementia with Lewy bodies: Secondary | ICD-10-CM | POA: Diagnosis not present

## 2018-09-23 DIAGNOSIS — R197 Diarrhea, unspecified: Secondary | ICD-10-CM | POA: Diagnosis not present

## 2018-09-24 DIAGNOSIS — R197 Diarrhea, unspecified: Secondary | ICD-10-CM | POA: Diagnosis not present

## 2018-09-24 DIAGNOSIS — Z881 Allergy status to other antibiotic agents status: Secondary | ICD-10-CM | POA: Diagnosis not present

## 2018-09-24 DIAGNOSIS — Z79899 Other long term (current) drug therapy: Secondary | ICD-10-CM | POA: Diagnosis not present

## 2018-09-24 DIAGNOSIS — R7989 Other specified abnormal findings of blood chemistry: Secondary | ICD-10-CM | POA: Diagnosis not present

## 2018-09-24 DIAGNOSIS — G3183 Dementia with Lewy bodies: Secondary | ICD-10-CM | POA: Diagnosis not present

## 2018-09-24 DIAGNOSIS — F0280 Dementia in other diseases classified elsewhere without behavioral disturbance: Secondary | ICD-10-CM | POA: Diagnosis not present

## 2018-09-27 DIAGNOSIS — F0280 Dementia in other diseases classified elsewhere without behavioral disturbance: Secondary | ICD-10-CM | POA: Diagnosis not present

## 2018-09-27 DIAGNOSIS — G3183 Dementia with Lewy bodies: Secondary | ICD-10-CM | POA: Diagnosis not present

## 2018-09-28 DIAGNOSIS — G3183 Dementia with Lewy bodies: Secondary | ICD-10-CM | POA: Diagnosis not present

## 2018-09-28 DIAGNOSIS — F0280 Dementia in other diseases classified elsewhere without behavioral disturbance: Secondary | ICD-10-CM | POA: Diagnosis not present

## 2018-09-29 DIAGNOSIS — F0280 Dementia in other diseases classified elsewhere without behavioral disturbance: Secondary | ICD-10-CM | POA: Diagnosis not present

## 2018-09-29 DIAGNOSIS — G3183 Dementia with Lewy bodies: Secondary | ICD-10-CM | POA: Diagnosis not present

## 2018-09-30 DIAGNOSIS — G3183 Dementia with Lewy bodies: Secondary | ICD-10-CM | POA: Diagnosis not present

## 2018-09-30 DIAGNOSIS — F0280 Dementia in other diseases classified elsewhere without behavioral disturbance: Secondary | ICD-10-CM | POA: Diagnosis not present

## 2018-10-05 DIAGNOSIS — R197 Diarrhea, unspecified: Secondary | ICD-10-CM | POA: Diagnosis not present

## 2018-10-07 DIAGNOSIS — R109 Unspecified abdominal pain: Secondary | ICD-10-CM | POA: Diagnosis not present

## 2018-10-15 DIAGNOSIS — G3183 Dementia with Lewy bodies: Secondary | ICD-10-CM | POA: Diagnosis not present

## 2018-10-15 DIAGNOSIS — F0280 Dementia in other diseases classified elsewhere without behavioral disturbance: Secondary | ICD-10-CM | POA: Diagnosis not present

## 2018-10-18 DIAGNOSIS — F0280 Dementia in other diseases classified elsewhere without behavioral disturbance: Secondary | ICD-10-CM | POA: Diagnosis not present

## 2018-10-18 DIAGNOSIS — G3183 Dementia with Lewy bodies: Secondary | ICD-10-CM | POA: Diagnosis not present

## 2018-10-20 DIAGNOSIS — F0280 Dementia in other diseases classified elsewhere without behavioral disturbance: Secondary | ICD-10-CM | POA: Diagnosis not present

## 2018-10-20 DIAGNOSIS — G3183 Dementia with Lewy bodies: Secondary | ICD-10-CM | POA: Diagnosis not present

## 2018-10-22 DIAGNOSIS — G3183 Dementia with Lewy bodies: Secondary | ICD-10-CM | POA: Diagnosis not present

## 2018-10-22 DIAGNOSIS — F0280 Dementia in other diseases classified elsewhere without behavioral disturbance: Secondary | ICD-10-CM | POA: Diagnosis not present

## 2018-10-25 DIAGNOSIS — G3183 Dementia with Lewy bodies: Secondary | ICD-10-CM | POA: Diagnosis not present

## 2018-10-25 DIAGNOSIS — F0280 Dementia in other diseases classified elsewhere without behavioral disturbance: Secondary | ICD-10-CM | POA: Diagnosis not present

## 2018-10-25 DIAGNOSIS — Z7409 Other reduced mobility: Secondary | ICD-10-CM | POA: Diagnosis not present

## 2018-10-26 DIAGNOSIS — Z7409 Other reduced mobility: Secondary | ICD-10-CM | POA: Diagnosis not present

## 2018-10-26 DIAGNOSIS — G3183 Dementia with Lewy bodies: Secondary | ICD-10-CM | POA: Diagnosis not present

## 2018-10-26 DIAGNOSIS — F0280 Dementia in other diseases classified elsewhere without behavioral disturbance: Secondary | ICD-10-CM | POA: Diagnosis not present

## 2018-11-01 DIAGNOSIS — F0280 Dementia in other diseases classified elsewhere without behavioral disturbance: Secondary | ICD-10-CM | POA: Diagnosis not present

## 2018-11-01 DIAGNOSIS — Z7409 Other reduced mobility: Secondary | ICD-10-CM | POA: Diagnosis not present

## 2018-11-01 DIAGNOSIS — G3183 Dementia with Lewy bodies: Secondary | ICD-10-CM | POA: Diagnosis not present

## 2018-11-02 DIAGNOSIS — G3183 Dementia with Lewy bodies: Secondary | ICD-10-CM | POA: Diagnosis not present

## 2018-11-02 DIAGNOSIS — F0280 Dementia in other diseases classified elsewhere without behavioral disturbance: Secondary | ICD-10-CM | POA: Diagnosis not present

## 2018-11-02 DIAGNOSIS — Z7409 Other reduced mobility: Secondary | ICD-10-CM | POA: Diagnosis not present

## 2018-11-04 DIAGNOSIS — G3183 Dementia with Lewy bodies: Secondary | ICD-10-CM | POA: Diagnosis not present

## 2018-11-04 DIAGNOSIS — F0280 Dementia in other diseases classified elsewhere without behavioral disturbance: Secondary | ICD-10-CM | POA: Diagnosis not present

## 2018-11-04 DIAGNOSIS — Z7409 Other reduced mobility: Secondary | ICD-10-CM | POA: Diagnosis not present

## 2018-11-09 DIAGNOSIS — G3183 Dementia with Lewy bodies: Secondary | ICD-10-CM | POA: Diagnosis not present

## 2018-11-09 DIAGNOSIS — Z7409 Other reduced mobility: Secondary | ICD-10-CM | POA: Diagnosis not present

## 2018-11-09 DIAGNOSIS — F0280 Dementia in other diseases classified elsewhere without behavioral disturbance: Secondary | ICD-10-CM | POA: Diagnosis not present

## 2018-11-11 DIAGNOSIS — G3183 Dementia with Lewy bodies: Secondary | ICD-10-CM | POA: Diagnosis not present

## 2018-11-11 DIAGNOSIS — G4719 Other hypersomnia: Secondary | ICD-10-CM | POA: Diagnosis not present

## 2018-11-11 DIAGNOSIS — F0281 Dementia in other diseases classified elsewhere with behavioral disturbance: Secondary | ICD-10-CM | POA: Diagnosis not present

## 2018-11-16 DIAGNOSIS — B354 Tinea corporis: Secondary | ICD-10-CM | POA: Diagnosis not present

## 2018-11-16 DIAGNOSIS — L218 Other seborrheic dermatitis: Secondary | ICD-10-CM | POA: Diagnosis not present

## 2018-11-22 DIAGNOSIS — M25511 Pain in right shoulder: Secondary | ICD-10-CM | POA: Diagnosis not present

## 2018-12-10 DIAGNOSIS — R451 Restlessness and agitation: Secondary | ICD-10-CM | POA: Diagnosis not present

## 2018-12-10 DIAGNOSIS — R634 Abnormal weight loss: Secondary | ICD-10-CM | POA: Diagnosis not present

## 2018-12-10 DIAGNOSIS — F0281 Dementia in other diseases classified elsewhere with behavioral disturbance: Secondary | ICD-10-CM | POA: Diagnosis not present

## 2018-12-10 DIAGNOSIS — G3183 Dementia with Lewy bodies: Secondary | ICD-10-CM | POA: Diagnosis not present

## 2018-12-11 DIAGNOSIS — R634 Abnormal weight loss: Secondary | ICD-10-CM | POA: Diagnosis not present

## 2018-12-11 DIAGNOSIS — R451 Restlessness and agitation: Secondary | ICD-10-CM | POA: Diagnosis not present

## 2018-12-11 DIAGNOSIS — F0281 Dementia in other diseases classified elsewhere with behavioral disturbance: Secondary | ICD-10-CM | POA: Diagnosis not present

## 2018-12-11 DIAGNOSIS — G3183 Dementia with Lewy bodies: Secondary | ICD-10-CM | POA: Diagnosis not present

## 2018-12-12 DIAGNOSIS — R451 Restlessness and agitation: Secondary | ICD-10-CM | POA: Diagnosis not present

## 2018-12-12 DIAGNOSIS — G3183 Dementia with Lewy bodies: Secondary | ICD-10-CM | POA: Diagnosis not present

## 2018-12-12 DIAGNOSIS — F0281 Dementia in other diseases classified elsewhere with behavioral disturbance: Secondary | ICD-10-CM | POA: Diagnosis not present

## 2018-12-12 DIAGNOSIS — R634 Abnormal weight loss: Secondary | ICD-10-CM | POA: Diagnosis not present

## 2018-12-13 DIAGNOSIS — R451 Restlessness and agitation: Secondary | ICD-10-CM | POA: Diagnosis not present

## 2018-12-13 DIAGNOSIS — F0281 Dementia in other diseases classified elsewhere with behavioral disturbance: Secondary | ICD-10-CM | POA: Diagnosis not present

## 2018-12-13 DIAGNOSIS — G3183 Dementia with Lewy bodies: Secondary | ICD-10-CM | POA: Diagnosis not present

## 2018-12-13 DIAGNOSIS — R634 Abnormal weight loss: Secondary | ICD-10-CM | POA: Diagnosis not present

## 2018-12-14 DIAGNOSIS — R451 Restlessness and agitation: Secondary | ICD-10-CM | POA: Diagnosis not present

## 2018-12-14 DIAGNOSIS — R634 Abnormal weight loss: Secondary | ICD-10-CM | POA: Diagnosis not present

## 2018-12-14 DIAGNOSIS — G3183 Dementia with Lewy bodies: Secondary | ICD-10-CM | POA: Diagnosis not present

## 2018-12-14 DIAGNOSIS — F0281 Dementia in other diseases classified elsewhere with behavioral disturbance: Secondary | ICD-10-CM | POA: Diagnosis not present

## 2018-12-15 DIAGNOSIS — G3183 Dementia with Lewy bodies: Secondary | ICD-10-CM | POA: Diagnosis not present

## 2018-12-15 DIAGNOSIS — R451 Restlessness and agitation: Secondary | ICD-10-CM | POA: Diagnosis not present

## 2018-12-15 DIAGNOSIS — R634 Abnormal weight loss: Secondary | ICD-10-CM | POA: Diagnosis not present

## 2018-12-15 DIAGNOSIS — F0281 Dementia in other diseases classified elsewhere with behavioral disturbance: Secondary | ICD-10-CM | POA: Diagnosis not present

## 2018-12-16 DIAGNOSIS — R451 Restlessness and agitation: Secondary | ICD-10-CM | POA: Diagnosis not present

## 2018-12-16 DIAGNOSIS — G3183 Dementia with Lewy bodies: Secondary | ICD-10-CM | POA: Diagnosis not present

## 2018-12-16 DIAGNOSIS — F0281 Dementia in other diseases classified elsewhere with behavioral disturbance: Secondary | ICD-10-CM | POA: Diagnosis not present

## 2018-12-16 DIAGNOSIS — R634 Abnormal weight loss: Secondary | ICD-10-CM | POA: Diagnosis not present

## 2018-12-17 DIAGNOSIS — F0281 Dementia in other diseases classified elsewhere with behavioral disturbance: Secondary | ICD-10-CM | POA: Diagnosis not present

## 2018-12-17 DIAGNOSIS — G3183 Dementia with Lewy bodies: Secondary | ICD-10-CM | POA: Diagnosis not present

## 2018-12-17 DIAGNOSIS — R451 Restlessness and agitation: Secondary | ICD-10-CM | POA: Diagnosis not present

## 2018-12-17 DIAGNOSIS — R634 Abnormal weight loss: Secondary | ICD-10-CM | POA: Diagnosis not present

## 2018-12-18 DIAGNOSIS — G3183 Dementia with Lewy bodies: Secondary | ICD-10-CM | POA: Diagnosis not present

## 2018-12-18 DIAGNOSIS — F0281 Dementia in other diseases classified elsewhere with behavioral disturbance: Secondary | ICD-10-CM | POA: Diagnosis not present

## 2018-12-18 DIAGNOSIS — R634 Abnormal weight loss: Secondary | ICD-10-CM | POA: Diagnosis not present

## 2018-12-18 DIAGNOSIS — R451 Restlessness and agitation: Secondary | ICD-10-CM | POA: Diagnosis not present

## 2018-12-19 DIAGNOSIS — G3183 Dementia with Lewy bodies: Secondary | ICD-10-CM | POA: Diagnosis not present

## 2018-12-19 DIAGNOSIS — R451 Restlessness and agitation: Secondary | ICD-10-CM | POA: Diagnosis not present

## 2018-12-19 DIAGNOSIS — R634 Abnormal weight loss: Secondary | ICD-10-CM | POA: Diagnosis not present

## 2018-12-19 DIAGNOSIS — F0281 Dementia in other diseases classified elsewhere with behavioral disturbance: Secondary | ICD-10-CM | POA: Diagnosis not present

## 2018-12-20 DIAGNOSIS — R634 Abnormal weight loss: Secondary | ICD-10-CM | POA: Diagnosis not present

## 2018-12-20 DIAGNOSIS — F0281 Dementia in other diseases classified elsewhere with behavioral disturbance: Secondary | ICD-10-CM | POA: Diagnosis not present

## 2018-12-20 DIAGNOSIS — G3183 Dementia with Lewy bodies: Secondary | ICD-10-CM | POA: Diagnosis not present

## 2018-12-20 DIAGNOSIS — R451 Restlessness and agitation: Secondary | ICD-10-CM | POA: Diagnosis not present

## 2018-12-21 DIAGNOSIS — G3183 Dementia with Lewy bodies: Secondary | ICD-10-CM | POA: Diagnosis not present

## 2018-12-21 DIAGNOSIS — R451 Restlessness and agitation: Secondary | ICD-10-CM | POA: Diagnosis not present

## 2018-12-21 DIAGNOSIS — F0281 Dementia in other diseases classified elsewhere with behavioral disturbance: Secondary | ICD-10-CM | POA: Diagnosis not present

## 2018-12-21 DIAGNOSIS — R634 Abnormal weight loss: Secondary | ICD-10-CM | POA: Diagnosis not present

## 2018-12-22 DIAGNOSIS — R451 Restlessness and agitation: Secondary | ICD-10-CM | POA: Diagnosis not present

## 2018-12-22 DIAGNOSIS — F0281 Dementia in other diseases classified elsewhere with behavioral disturbance: Secondary | ICD-10-CM | POA: Diagnosis not present

## 2018-12-22 DIAGNOSIS — R634 Abnormal weight loss: Secondary | ICD-10-CM | POA: Diagnosis not present

## 2018-12-22 DIAGNOSIS — G3183 Dementia with Lewy bodies: Secondary | ICD-10-CM | POA: Diagnosis not present

## 2018-12-23 DIAGNOSIS — F0281 Dementia in other diseases classified elsewhere with behavioral disturbance: Secondary | ICD-10-CM | POA: Diagnosis not present

## 2018-12-23 DIAGNOSIS — R451 Restlessness and agitation: Secondary | ICD-10-CM | POA: Diagnosis not present

## 2018-12-23 DIAGNOSIS — G3183 Dementia with Lewy bodies: Secondary | ICD-10-CM | POA: Diagnosis not present

## 2018-12-23 DIAGNOSIS — R634 Abnormal weight loss: Secondary | ICD-10-CM | POA: Diagnosis not present

## 2018-12-24 DIAGNOSIS — R634 Abnormal weight loss: Secondary | ICD-10-CM | POA: Diagnosis not present

## 2018-12-24 DIAGNOSIS — F0281 Dementia in other diseases classified elsewhere with behavioral disturbance: Secondary | ICD-10-CM | POA: Diagnosis not present

## 2018-12-24 DIAGNOSIS — G3183 Dementia with Lewy bodies: Secondary | ICD-10-CM | POA: Diagnosis not present

## 2018-12-24 DIAGNOSIS — R451 Restlessness and agitation: Secondary | ICD-10-CM | POA: Diagnosis not present

## 2018-12-25 DIAGNOSIS — R451 Restlessness and agitation: Secondary | ICD-10-CM | POA: Diagnosis not present

## 2018-12-25 DIAGNOSIS — R634 Abnormal weight loss: Secondary | ICD-10-CM | POA: Diagnosis not present

## 2018-12-25 DIAGNOSIS — F0281 Dementia in other diseases classified elsewhere with behavioral disturbance: Secondary | ICD-10-CM | POA: Diagnosis not present

## 2018-12-25 DIAGNOSIS — G3183 Dementia with Lewy bodies: Secondary | ICD-10-CM | POA: Diagnosis not present

## 2018-12-26 DIAGNOSIS — R634 Abnormal weight loss: Secondary | ICD-10-CM | POA: Diagnosis not present

## 2018-12-26 DIAGNOSIS — R451 Restlessness and agitation: Secondary | ICD-10-CM | POA: Diagnosis not present

## 2018-12-26 DIAGNOSIS — G3183 Dementia with Lewy bodies: Secondary | ICD-10-CM | POA: Diagnosis not present

## 2018-12-26 DIAGNOSIS — F0281 Dementia in other diseases classified elsewhere with behavioral disturbance: Secondary | ICD-10-CM | POA: Diagnosis not present

## 2018-12-27 DIAGNOSIS — F0281 Dementia in other diseases classified elsewhere with behavioral disturbance: Secondary | ICD-10-CM | POA: Diagnosis not present

## 2018-12-27 DIAGNOSIS — R451 Restlessness and agitation: Secondary | ICD-10-CM | POA: Diagnosis not present

## 2018-12-27 DIAGNOSIS — R634 Abnormal weight loss: Secondary | ICD-10-CM | POA: Diagnosis not present

## 2018-12-27 DIAGNOSIS — G3183 Dementia with Lewy bodies: Secondary | ICD-10-CM | POA: Diagnosis not present

## 2018-12-28 DIAGNOSIS — R634 Abnormal weight loss: Secondary | ICD-10-CM | POA: Diagnosis not present

## 2018-12-28 DIAGNOSIS — F0281 Dementia in other diseases classified elsewhere with behavioral disturbance: Secondary | ICD-10-CM | POA: Diagnosis not present

## 2018-12-28 DIAGNOSIS — G3183 Dementia with Lewy bodies: Secondary | ICD-10-CM | POA: Diagnosis not present

## 2018-12-28 DIAGNOSIS — R451 Restlessness and agitation: Secondary | ICD-10-CM | POA: Diagnosis not present

## 2018-12-29 DIAGNOSIS — R634 Abnormal weight loss: Secondary | ICD-10-CM | POA: Diagnosis not present

## 2018-12-29 DIAGNOSIS — F0281 Dementia in other diseases classified elsewhere with behavioral disturbance: Secondary | ICD-10-CM | POA: Diagnosis not present

## 2018-12-29 DIAGNOSIS — G3183 Dementia with Lewy bodies: Secondary | ICD-10-CM | POA: Diagnosis not present

## 2018-12-29 DIAGNOSIS — R451 Restlessness and agitation: Secondary | ICD-10-CM | POA: Diagnosis not present

## 2018-12-30 DIAGNOSIS — R451 Restlessness and agitation: Secondary | ICD-10-CM | POA: Diagnosis not present

## 2018-12-30 DIAGNOSIS — R634 Abnormal weight loss: Secondary | ICD-10-CM | POA: Diagnosis not present

## 2018-12-30 DIAGNOSIS — G3183 Dementia with Lewy bodies: Secondary | ICD-10-CM | POA: Diagnosis not present

## 2018-12-30 DIAGNOSIS — F0281 Dementia in other diseases classified elsewhere with behavioral disturbance: Secondary | ICD-10-CM | POA: Diagnosis not present

## 2018-12-31 DIAGNOSIS — R451 Restlessness and agitation: Secondary | ICD-10-CM | POA: Diagnosis not present

## 2018-12-31 DIAGNOSIS — R634 Abnormal weight loss: Secondary | ICD-10-CM | POA: Diagnosis not present

## 2018-12-31 DIAGNOSIS — F0281 Dementia in other diseases classified elsewhere with behavioral disturbance: Secondary | ICD-10-CM | POA: Diagnosis not present

## 2018-12-31 DIAGNOSIS — G3183 Dementia with Lewy bodies: Secondary | ICD-10-CM | POA: Diagnosis not present

## 2019-01-01 DIAGNOSIS — G3183 Dementia with Lewy bodies: Secondary | ICD-10-CM | POA: Diagnosis not present

## 2019-01-01 DIAGNOSIS — R634 Abnormal weight loss: Secondary | ICD-10-CM | POA: Diagnosis not present

## 2019-01-01 DIAGNOSIS — F0281 Dementia in other diseases classified elsewhere with behavioral disturbance: Secondary | ICD-10-CM | POA: Diagnosis not present

## 2019-01-01 DIAGNOSIS — R451 Restlessness and agitation: Secondary | ICD-10-CM | POA: Diagnosis not present

## 2019-01-02 DIAGNOSIS — G3183 Dementia with Lewy bodies: Secondary | ICD-10-CM | POA: Diagnosis not present

## 2019-01-02 DIAGNOSIS — R451 Restlessness and agitation: Secondary | ICD-10-CM | POA: Diagnosis not present

## 2019-01-02 DIAGNOSIS — R634 Abnormal weight loss: Secondary | ICD-10-CM | POA: Diagnosis not present

## 2019-01-02 DIAGNOSIS — F0281 Dementia in other diseases classified elsewhere with behavioral disturbance: Secondary | ICD-10-CM | POA: Diagnosis not present

## 2019-01-03 DIAGNOSIS — F0281 Dementia in other diseases classified elsewhere with behavioral disturbance: Secondary | ICD-10-CM | POA: Diagnosis not present

## 2019-01-03 DIAGNOSIS — R451 Restlessness and agitation: Secondary | ICD-10-CM | POA: Diagnosis not present

## 2019-01-03 DIAGNOSIS — G3183 Dementia with Lewy bodies: Secondary | ICD-10-CM | POA: Diagnosis not present

## 2019-01-03 DIAGNOSIS — R634 Abnormal weight loss: Secondary | ICD-10-CM | POA: Diagnosis not present

## 2019-01-04 DIAGNOSIS — R451 Restlessness and agitation: Secondary | ICD-10-CM | POA: Diagnosis not present

## 2019-01-04 DIAGNOSIS — F0281 Dementia in other diseases classified elsewhere with behavioral disturbance: Secondary | ICD-10-CM | POA: Diagnosis not present

## 2019-01-04 DIAGNOSIS — R634 Abnormal weight loss: Secondary | ICD-10-CM | POA: Diagnosis not present

## 2019-01-04 DIAGNOSIS — G3183 Dementia with Lewy bodies: Secondary | ICD-10-CM | POA: Diagnosis not present

## 2019-01-05 DIAGNOSIS — F0281 Dementia in other diseases classified elsewhere with behavioral disturbance: Secondary | ICD-10-CM | POA: Diagnosis not present

## 2019-01-05 DIAGNOSIS — R634 Abnormal weight loss: Secondary | ICD-10-CM | POA: Diagnosis not present

## 2019-01-05 DIAGNOSIS — R451 Restlessness and agitation: Secondary | ICD-10-CM | POA: Diagnosis not present

## 2019-01-05 DIAGNOSIS — G3183 Dementia with Lewy bodies: Secondary | ICD-10-CM | POA: Diagnosis not present

## 2019-01-06 DIAGNOSIS — R634 Abnormal weight loss: Secondary | ICD-10-CM | POA: Diagnosis not present

## 2019-01-06 DIAGNOSIS — F0281 Dementia in other diseases classified elsewhere with behavioral disturbance: Secondary | ICD-10-CM | POA: Diagnosis not present

## 2019-01-06 DIAGNOSIS — G3183 Dementia with Lewy bodies: Secondary | ICD-10-CM | POA: Diagnosis not present

## 2019-01-06 DIAGNOSIS — R451 Restlessness and agitation: Secondary | ICD-10-CM | POA: Diagnosis not present

## 2019-01-07 DIAGNOSIS — R451 Restlessness and agitation: Secondary | ICD-10-CM | POA: Diagnosis not present

## 2019-01-07 DIAGNOSIS — R634 Abnormal weight loss: Secondary | ICD-10-CM | POA: Diagnosis not present

## 2019-01-07 DIAGNOSIS — G3183 Dementia with Lewy bodies: Secondary | ICD-10-CM | POA: Diagnosis not present

## 2019-01-07 DIAGNOSIS — F0281 Dementia in other diseases classified elsewhere with behavioral disturbance: Secondary | ICD-10-CM | POA: Diagnosis not present

## 2019-01-08 DIAGNOSIS — R634 Abnormal weight loss: Secondary | ICD-10-CM | POA: Diagnosis not present

## 2019-01-08 DIAGNOSIS — G3183 Dementia with Lewy bodies: Secondary | ICD-10-CM | POA: Diagnosis not present

## 2019-01-08 DIAGNOSIS — F0281 Dementia in other diseases classified elsewhere with behavioral disturbance: Secondary | ICD-10-CM | POA: Diagnosis not present

## 2019-01-08 DIAGNOSIS — R451 Restlessness and agitation: Secondary | ICD-10-CM | POA: Diagnosis not present

## 2019-01-09 DIAGNOSIS — F0281 Dementia in other diseases classified elsewhere with behavioral disturbance: Secondary | ICD-10-CM | POA: Diagnosis not present

## 2019-01-09 DIAGNOSIS — G3183 Dementia with Lewy bodies: Secondary | ICD-10-CM | POA: Diagnosis not present

## 2019-01-09 DIAGNOSIS — R451 Restlessness and agitation: Secondary | ICD-10-CM | POA: Diagnosis not present

## 2019-01-09 DIAGNOSIS — R634 Abnormal weight loss: Secondary | ICD-10-CM | POA: Diagnosis not present

## 2019-01-10 DIAGNOSIS — F0281 Dementia in other diseases classified elsewhere with behavioral disturbance: Secondary | ICD-10-CM | POA: Diagnosis not present

## 2019-01-10 DIAGNOSIS — G3183 Dementia with Lewy bodies: Secondary | ICD-10-CM | POA: Diagnosis not present

## 2019-01-10 DIAGNOSIS — R634 Abnormal weight loss: Secondary | ICD-10-CM | POA: Diagnosis not present

## 2019-01-10 DIAGNOSIS — R451 Restlessness and agitation: Secondary | ICD-10-CM | POA: Diagnosis not present

## 2019-01-11 DIAGNOSIS — R451 Restlessness and agitation: Secondary | ICD-10-CM | POA: Diagnosis not present

## 2019-01-11 DIAGNOSIS — G3183 Dementia with Lewy bodies: Secondary | ICD-10-CM | POA: Diagnosis not present

## 2019-01-11 DIAGNOSIS — F0281 Dementia in other diseases classified elsewhere with behavioral disturbance: Secondary | ICD-10-CM | POA: Diagnosis not present

## 2019-01-11 DIAGNOSIS — R634 Abnormal weight loss: Secondary | ICD-10-CM | POA: Diagnosis not present

## 2019-01-12 DIAGNOSIS — R451 Restlessness and agitation: Secondary | ICD-10-CM | POA: Diagnosis not present

## 2019-01-12 DIAGNOSIS — R634 Abnormal weight loss: Secondary | ICD-10-CM | POA: Diagnosis not present

## 2019-01-12 DIAGNOSIS — F0281 Dementia in other diseases classified elsewhere with behavioral disturbance: Secondary | ICD-10-CM | POA: Diagnosis not present

## 2019-01-12 DIAGNOSIS — G3183 Dementia with Lewy bodies: Secondary | ICD-10-CM | POA: Diagnosis not present

## 2019-01-13 DIAGNOSIS — G3183 Dementia with Lewy bodies: Secondary | ICD-10-CM | POA: Diagnosis not present

## 2019-01-13 DIAGNOSIS — F0281 Dementia in other diseases classified elsewhere with behavioral disturbance: Secondary | ICD-10-CM | POA: Diagnosis not present

## 2019-01-13 DIAGNOSIS — R451 Restlessness and agitation: Secondary | ICD-10-CM | POA: Diagnosis not present

## 2019-01-13 DIAGNOSIS — R634 Abnormal weight loss: Secondary | ICD-10-CM | POA: Diagnosis not present

## 2019-01-14 DIAGNOSIS — Z79899 Other long term (current) drug therapy: Secondary | ICD-10-CM | POA: Diagnosis not present

## 2019-01-14 DIAGNOSIS — F0281 Dementia in other diseases classified elsewhere with behavioral disturbance: Secondary | ICD-10-CM | POA: Diagnosis not present

## 2019-01-14 DIAGNOSIS — R451 Restlessness and agitation: Secondary | ICD-10-CM | POA: Diagnosis not present

## 2019-01-14 DIAGNOSIS — G3183 Dementia with Lewy bodies: Secondary | ICD-10-CM | POA: Diagnosis not present

## 2019-01-14 DIAGNOSIS — R634 Abnormal weight loss: Secondary | ICD-10-CM | POA: Diagnosis not present

## 2019-01-15 DIAGNOSIS — R451 Restlessness and agitation: Secondary | ICD-10-CM | POA: Diagnosis not present

## 2019-01-15 DIAGNOSIS — R634 Abnormal weight loss: Secondary | ICD-10-CM | POA: Diagnosis not present

## 2019-01-15 DIAGNOSIS — G3183 Dementia with Lewy bodies: Secondary | ICD-10-CM | POA: Diagnosis not present

## 2019-01-15 DIAGNOSIS — F0281 Dementia in other diseases classified elsewhere with behavioral disturbance: Secondary | ICD-10-CM | POA: Diagnosis not present

## 2019-01-16 DIAGNOSIS — R634 Abnormal weight loss: Secondary | ICD-10-CM | POA: Diagnosis not present

## 2019-01-16 DIAGNOSIS — G3183 Dementia with Lewy bodies: Secondary | ICD-10-CM | POA: Diagnosis not present

## 2019-01-16 DIAGNOSIS — R451 Restlessness and agitation: Secondary | ICD-10-CM | POA: Diagnosis not present

## 2019-01-16 DIAGNOSIS — F0281 Dementia in other diseases classified elsewhere with behavioral disturbance: Secondary | ICD-10-CM | POA: Diagnosis not present

## 2019-01-17 DIAGNOSIS — R451 Restlessness and agitation: Secondary | ICD-10-CM | POA: Diagnosis not present

## 2019-01-17 DIAGNOSIS — F0281 Dementia in other diseases classified elsewhere with behavioral disturbance: Secondary | ICD-10-CM | POA: Diagnosis not present

## 2019-01-17 DIAGNOSIS — R634 Abnormal weight loss: Secondary | ICD-10-CM | POA: Diagnosis not present

## 2019-01-17 DIAGNOSIS — G3183 Dementia with Lewy bodies: Secondary | ICD-10-CM | POA: Diagnosis not present

## 2019-01-18 DIAGNOSIS — R634 Abnormal weight loss: Secondary | ICD-10-CM | POA: Diagnosis not present

## 2019-01-18 DIAGNOSIS — G3183 Dementia with Lewy bodies: Secondary | ICD-10-CM | POA: Diagnosis not present

## 2019-01-18 DIAGNOSIS — R451 Restlessness and agitation: Secondary | ICD-10-CM | POA: Diagnosis not present

## 2019-01-18 DIAGNOSIS — F0281 Dementia in other diseases classified elsewhere with behavioral disturbance: Secondary | ICD-10-CM | POA: Diagnosis not present

## 2019-01-19 DIAGNOSIS — F0281 Dementia in other diseases classified elsewhere with behavioral disturbance: Secondary | ICD-10-CM | POA: Diagnosis not present

## 2019-01-19 DIAGNOSIS — R634 Abnormal weight loss: Secondary | ICD-10-CM | POA: Diagnosis not present

## 2019-01-19 DIAGNOSIS — G3183 Dementia with Lewy bodies: Secondary | ICD-10-CM | POA: Diagnosis not present

## 2019-01-19 DIAGNOSIS — R451 Restlessness and agitation: Secondary | ICD-10-CM | POA: Diagnosis not present

## 2019-01-20 DIAGNOSIS — F0281 Dementia in other diseases classified elsewhere with behavioral disturbance: Secondary | ICD-10-CM | POA: Diagnosis not present

## 2019-01-20 DIAGNOSIS — R451 Restlessness and agitation: Secondary | ICD-10-CM | POA: Diagnosis not present

## 2019-01-20 DIAGNOSIS — G3183 Dementia with Lewy bodies: Secondary | ICD-10-CM | POA: Diagnosis not present

## 2019-01-20 DIAGNOSIS — R634 Abnormal weight loss: Secondary | ICD-10-CM | POA: Diagnosis not present

## 2019-01-21 DIAGNOSIS — G3183 Dementia with Lewy bodies: Secondary | ICD-10-CM | POA: Diagnosis not present

## 2019-01-21 DIAGNOSIS — R451 Restlessness and agitation: Secondary | ICD-10-CM | POA: Diagnosis not present

## 2019-01-21 DIAGNOSIS — R634 Abnormal weight loss: Secondary | ICD-10-CM | POA: Diagnosis not present

## 2019-01-21 DIAGNOSIS — F0281 Dementia in other diseases classified elsewhere with behavioral disturbance: Secondary | ICD-10-CM | POA: Diagnosis not present

## 2019-01-22 DIAGNOSIS — G3183 Dementia with Lewy bodies: Secondary | ICD-10-CM | POA: Diagnosis not present

## 2019-01-22 DIAGNOSIS — F0281 Dementia in other diseases classified elsewhere with behavioral disturbance: Secondary | ICD-10-CM | POA: Diagnosis not present

## 2019-01-22 DIAGNOSIS — R634 Abnormal weight loss: Secondary | ICD-10-CM | POA: Diagnosis not present

## 2019-01-22 DIAGNOSIS — R451 Restlessness and agitation: Secondary | ICD-10-CM | POA: Diagnosis not present

## 2019-01-23 DIAGNOSIS — R451 Restlessness and agitation: Secondary | ICD-10-CM | POA: Diagnosis not present

## 2019-01-23 DIAGNOSIS — F0281 Dementia in other diseases classified elsewhere with behavioral disturbance: Secondary | ICD-10-CM | POA: Diagnosis not present

## 2019-01-23 DIAGNOSIS — R634 Abnormal weight loss: Secondary | ICD-10-CM | POA: Diagnosis not present

## 2019-01-23 DIAGNOSIS — G3183 Dementia with Lewy bodies: Secondary | ICD-10-CM | POA: Diagnosis not present

## 2019-01-24 DIAGNOSIS — R451 Restlessness and agitation: Secondary | ICD-10-CM | POA: Diagnosis not present

## 2019-01-24 DIAGNOSIS — F0281 Dementia in other diseases classified elsewhere with behavioral disturbance: Secondary | ICD-10-CM | POA: Diagnosis not present

## 2019-01-24 DIAGNOSIS — G3183 Dementia with Lewy bodies: Secondary | ICD-10-CM | POA: Diagnosis not present

## 2019-01-24 DIAGNOSIS — R634 Abnormal weight loss: Secondary | ICD-10-CM | POA: Diagnosis not present

## 2019-01-25 DIAGNOSIS — R634 Abnormal weight loss: Secondary | ICD-10-CM | POA: Diagnosis not present

## 2019-01-25 DIAGNOSIS — R451 Restlessness and agitation: Secondary | ICD-10-CM | POA: Diagnosis not present

## 2019-01-25 DIAGNOSIS — G3183 Dementia with Lewy bodies: Secondary | ICD-10-CM | POA: Diagnosis not present

## 2019-01-25 DIAGNOSIS — F0281 Dementia in other diseases classified elsewhere with behavioral disturbance: Secondary | ICD-10-CM | POA: Diagnosis not present

## 2019-01-26 DIAGNOSIS — R634 Abnormal weight loss: Secondary | ICD-10-CM | POA: Diagnosis not present

## 2019-01-26 DIAGNOSIS — G3183 Dementia with Lewy bodies: Secondary | ICD-10-CM | POA: Diagnosis not present

## 2019-01-26 DIAGNOSIS — R451 Restlessness and agitation: Secondary | ICD-10-CM | POA: Diagnosis not present

## 2019-01-26 DIAGNOSIS — F0281 Dementia in other diseases classified elsewhere with behavioral disturbance: Secondary | ICD-10-CM | POA: Diagnosis not present

## 2019-01-27 DIAGNOSIS — G3183 Dementia with Lewy bodies: Secondary | ICD-10-CM | POA: Diagnosis not present

## 2019-01-27 DIAGNOSIS — R634 Abnormal weight loss: Secondary | ICD-10-CM | POA: Diagnosis not present

## 2019-01-27 DIAGNOSIS — R451 Restlessness and agitation: Secondary | ICD-10-CM | POA: Diagnosis not present

## 2019-01-27 DIAGNOSIS — F0281 Dementia in other diseases classified elsewhere with behavioral disturbance: Secondary | ICD-10-CM | POA: Diagnosis not present

## 2019-01-28 DIAGNOSIS — F0281 Dementia in other diseases classified elsewhere with behavioral disturbance: Secondary | ICD-10-CM | POA: Diagnosis not present

## 2019-01-28 DIAGNOSIS — R634 Abnormal weight loss: Secondary | ICD-10-CM | POA: Diagnosis not present

## 2019-01-28 DIAGNOSIS — R451 Restlessness and agitation: Secondary | ICD-10-CM | POA: Diagnosis not present

## 2019-01-28 DIAGNOSIS — G3183 Dementia with Lewy bodies: Secondary | ICD-10-CM | POA: Diagnosis not present

## 2019-01-29 DIAGNOSIS — R451 Restlessness and agitation: Secondary | ICD-10-CM | POA: Diagnosis not present

## 2019-01-29 DIAGNOSIS — F0281 Dementia in other diseases classified elsewhere with behavioral disturbance: Secondary | ICD-10-CM | POA: Diagnosis not present

## 2019-01-29 DIAGNOSIS — G3183 Dementia with Lewy bodies: Secondary | ICD-10-CM | POA: Diagnosis not present

## 2019-01-29 DIAGNOSIS — R634 Abnormal weight loss: Secondary | ICD-10-CM | POA: Diagnosis not present

## 2019-01-30 DIAGNOSIS — F0281 Dementia in other diseases classified elsewhere with behavioral disturbance: Secondary | ICD-10-CM | POA: Diagnosis not present

## 2019-01-30 DIAGNOSIS — R451 Restlessness and agitation: Secondary | ICD-10-CM | POA: Diagnosis not present

## 2019-01-30 DIAGNOSIS — R634 Abnormal weight loss: Secondary | ICD-10-CM | POA: Diagnosis not present

## 2019-01-30 DIAGNOSIS — G3183 Dementia with Lewy bodies: Secondary | ICD-10-CM | POA: Diagnosis not present

## 2019-01-31 DIAGNOSIS — R634 Abnormal weight loss: Secondary | ICD-10-CM | POA: Diagnosis not present

## 2019-01-31 DIAGNOSIS — G3183 Dementia with Lewy bodies: Secondary | ICD-10-CM | POA: Diagnosis not present

## 2019-01-31 DIAGNOSIS — R451 Restlessness and agitation: Secondary | ICD-10-CM | POA: Diagnosis not present

## 2019-01-31 DIAGNOSIS — F0281 Dementia in other diseases classified elsewhere with behavioral disturbance: Secondary | ICD-10-CM | POA: Diagnosis not present

## 2019-02-01 DIAGNOSIS — F0281 Dementia in other diseases classified elsewhere with behavioral disturbance: Secondary | ICD-10-CM | POA: Diagnosis not present

## 2019-02-01 DIAGNOSIS — R634 Abnormal weight loss: Secondary | ICD-10-CM | POA: Diagnosis not present

## 2019-02-01 DIAGNOSIS — G3183 Dementia with Lewy bodies: Secondary | ICD-10-CM | POA: Diagnosis not present

## 2019-02-01 DIAGNOSIS — R451 Restlessness and agitation: Secondary | ICD-10-CM | POA: Diagnosis not present

## 2019-02-02 DIAGNOSIS — F0281 Dementia in other diseases classified elsewhere with behavioral disturbance: Secondary | ICD-10-CM | POA: Diagnosis not present

## 2019-02-02 DIAGNOSIS — R634 Abnormal weight loss: Secondary | ICD-10-CM | POA: Diagnosis not present

## 2019-02-02 DIAGNOSIS — G3183 Dementia with Lewy bodies: Secondary | ICD-10-CM | POA: Diagnosis not present

## 2019-02-02 DIAGNOSIS — R451 Restlessness and agitation: Secondary | ICD-10-CM | POA: Diagnosis not present

## 2019-02-03 DIAGNOSIS — R634 Abnormal weight loss: Secondary | ICD-10-CM | POA: Diagnosis not present

## 2019-02-03 DIAGNOSIS — R451 Restlessness and agitation: Secondary | ICD-10-CM | POA: Diagnosis not present

## 2019-02-03 DIAGNOSIS — G3183 Dementia with Lewy bodies: Secondary | ICD-10-CM | POA: Diagnosis not present

## 2019-02-03 DIAGNOSIS — F0281 Dementia in other diseases classified elsewhere with behavioral disturbance: Secondary | ICD-10-CM | POA: Diagnosis not present

## 2019-02-04 DIAGNOSIS — G3183 Dementia with Lewy bodies: Secondary | ICD-10-CM | POA: Diagnosis not present

## 2019-02-04 DIAGNOSIS — F0281 Dementia in other diseases classified elsewhere with behavioral disturbance: Secondary | ICD-10-CM | POA: Diagnosis not present

## 2019-02-04 DIAGNOSIS — R451 Restlessness and agitation: Secondary | ICD-10-CM | POA: Diagnosis not present

## 2019-02-04 DIAGNOSIS — R634 Abnormal weight loss: Secondary | ICD-10-CM | POA: Diagnosis not present

## 2019-02-05 DIAGNOSIS — G3183 Dementia with Lewy bodies: Secondary | ICD-10-CM | POA: Diagnosis not present

## 2019-02-05 DIAGNOSIS — R451 Restlessness and agitation: Secondary | ICD-10-CM | POA: Diagnosis not present

## 2019-02-05 DIAGNOSIS — R634 Abnormal weight loss: Secondary | ICD-10-CM | POA: Diagnosis not present

## 2019-02-05 DIAGNOSIS — F0281 Dementia in other diseases classified elsewhere with behavioral disturbance: Secondary | ICD-10-CM | POA: Diagnosis not present

## 2019-02-06 DIAGNOSIS — G3183 Dementia with Lewy bodies: Secondary | ICD-10-CM | POA: Diagnosis not present

## 2019-02-06 DIAGNOSIS — F0281 Dementia in other diseases classified elsewhere with behavioral disturbance: Secondary | ICD-10-CM | POA: Diagnosis not present

## 2019-02-06 DIAGNOSIS — R451 Restlessness and agitation: Secondary | ICD-10-CM | POA: Diagnosis not present

## 2019-02-06 DIAGNOSIS — R634 Abnormal weight loss: Secondary | ICD-10-CM | POA: Diagnosis not present

## 2019-02-07 DIAGNOSIS — R634 Abnormal weight loss: Secondary | ICD-10-CM | POA: Diagnosis not present

## 2019-02-07 DIAGNOSIS — R451 Restlessness and agitation: Secondary | ICD-10-CM | POA: Diagnosis not present

## 2019-02-07 DIAGNOSIS — F0281 Dementia in other diseases classified elsewhere with behavioral disturbance: Secondary | ICD-10-CM | POA: Diagnosis not present

## 2019-02-07 DIAGNOSIS — G3183 Dementia with Lewy bodies: Secondary | ICD-10-CM | POA: Diagnosis not present

## 2019-02-08 DIAGNOSIS — F0281 Dementia in other diseases classified elsewhere with behavioral disturbance: Secondary | ICD-10-CM | POA: Diagnosis not present

## 2019-02-08 DIAGNOSIS — G3183 Dementia with Lewy bodies: Secondary | ICD-10-CM | POA: Diagnosis not present

## 2019-02-08 DIAGNOSIS — R451 Restlessness and agitation: Secondary | ICD-10-CM | POA: Diagnosis not present

## 2019-02-08 DIAGNOSIS — R634 Abnormal weight loss: Secondary | ICD-10-CM | POA: Diagnosis not present

## 2019-02-09 DIAGNOSIS — G3183 Dementia with Lewy bodies: Secondary | ICD-10-CM | POA: Diagnosis not present

## 2019-02-09 DIAGNOSIS — R451 Restlessness and agitation: Secondary | ICD-10-CM | POA: Diagnosis not present

## 2019-02-09 DIAGNOSIS — R634 Abnormal weight loss: Secondary | ICD-10-CM | POA: Diagnosis not present

## 2019-02-09 DIAGNOSIS — F0281 Dementia in other diseases classified elsewhere with behavioral disturbance: Secondary | ICD-10-CM | POA: Diagnosis not present

## 2019-02-10 DIAGNOSIS — R634 Abnormal weight loss: Secondary | ICD-10-CM | POA: Diagnosis not present

## 2019-02-10 DIAGNOSIS — G3183 Dementia with Lewy bodies: Secondary | ICD-10-CM | POA: Diagnosis not present

## 2019-02-10 DIAGNOSIS — F0281 Dementia in other diseases classified elsewhere with behavioral disturbance: Secondary | ICD-10-CM | POA: Diagnosis not present

## 2019-02-10 DIAGNOSIS — R451 Restlessness and agitation: Secondary | ICD-10-CM | POA: Diagnosis not present

## 2019-02-11 DIAGNOSIS — R451 Restlessness and agitation: Secondary | ICD-10-CM | POA: Diagnosis not present

## 2019-02-11 DIAGNOSIS — F0281 Dementia in other diseases classified elsewhere with behavioral disturbance: Secondary | ICD-10-CM | POA: Diagnosis not present

## 2019-02-11 DIAGNOSIS — R634 Abnormal weight loss: Secondary | ICD-10-CM | POA: Diagnosis not present

## 2019-02-11 DIAGNOSIS — G3183 Dementia with Lewy bodies: Secondary | ICD-10-CM | POA: Diagnosis not present

## 2019-02-12 DIAGNOSIS — R451 Restlessness and agitation: Secondary | ICD-10-CM | POA: Diagnosis not present

## 2019-02-12 DIAGNOSIS — R634 Abnormal weight loss: Secondary | ICD-10-CM | POA: Diagnosis not present

## 2019-02-12 DIAGNOSIS — F0281 Dementia in other diseases classified elsewhere with behavioral disturbance: Secondary | ICD-10-CM | POA: Diagnosis not present

## 2019-02-12 DIAGNOSIS — G3183 Dementia with Lewy bodies: Secondary | ICD-10-CM | POA: Diagnosis not present

## 2019-02-13 DIAGNOSIS — F0281 Dementia in other diseases classified elsewhere with behavioral disturbance: Secondary | ICD-10-CM | POA: Diagnosis not present

## 2019-02-13 DIAGNOSIS — G3183 Dementia with Lewy bodies: Secondary | ICD-10-CM | POA: Diagnosis not present

## 2019-02-13 DIAGNOSIS — R451 Restlessness and agitation: Secondary | ICD-10-CM | POA: Diagnosis not present

## 2019-02-13 DIAGNOSIS — R634 Abnormal weight loss: Secondary | ICD-10-CM | POA: Diagnosis not present

## 2019-02-14 DIAGNOSIS — R451 Restlessness and agitation: Secondary | ICD-10-CM | POA: Diagnosis not present

## 2019-02-14 DIAGNOSIS — R634 Abnormal weight loss: Secondary | ICD-10-CM | POA: Diagnosis not present

## 2019-02-14 DIAGNOSIS — G3183 Dementia with Lewy bodies: Secondary | ICD-10-CM | POA: Diagnosis not present

## 2019-02-14 DIAGNOSIS — F0281 Dementia in other diseases classified elsewhere with behavioral disturbance: Secondary | ICD-10-CM | POA: Diagnosis not present

## 2019-02-15 DIAGNOSIS — R451 Restlessness and agitation: Secondary | ICD-10-CM | POA: Diagnosis not present

## 2019-02-15 DIAGNOSIS — R634 Abnormal weight loss: Secondary | ICD-10-CM | POA: Diagnosis not present

## 2019-02-15 DIAGNOSIS — F0281 Dementia in other diseases classified elsewhere with behavioral disturbance: Secondary | ICD-10-CM | POA: Diagnosis not present

## 2019-02-15 DIAGNOSIS — G3183 Dementia with Lewy bodies: Secondary | ICD-10-CM | POA: Diagnosis not present

## 2019-02-16 DIAGNOSIS — R451 Restlessness and agitation: Secondary | ICD-10-CM | POA: Diagnosis not present

## 2019-02-16 DIAGNOSIS — G3183 Dementia with Lewy bodies: Secondary | ICD-10-CM | POA: Diagnosis not present

## 2019-02-16 DIAGNOSIS — F0281 Dementia in other diseases classified elsewhere with behavioral disturbance: Secondary | ICD-10-CM | POA: Diagnosis not present

## 2019-02-16 DIAGNOSIS — R634 Abnormal weight loss: Secondary | ICD-10-CM | POA: Diagnosis not present

## 2019-02-17 DIAGNOSIS — G3183 Dementia with Lewy bodies: Secondary | ICD-10-CM | POA: Diagnosis not present

## 2019-02-17 DIAGNOSIS — Z66 Do not resuscitate: Secondary | ICD-10-CM | POA: Diagnosis not present

## 2019-02-17 DIAGNOSIS — R634 Abnormal weight loss: Secondary | ICD-10-CM | POA: Diagnosis not present

## 2019-02-17 DIAGNOSIS — R451 Restlessness and agitation: Secondary | ICD-10-CM | POA: Diagnosis not present

## 2019-02-17 DIAGNOSIS — F0281 Dementia in other diseases classified elsewhere with behavioral disturbance: Secondary | ICD-10-CM | POA: Diagnosis not present

## 2019-02-18 DIAGNOSIS — F0281 Dementia in other diseases classified elsewhere with behavioral disturbance: Secondary | ICD-10-CM | POA: Diagnosis not present

## 2019-02-18 DIAGNOSIS — R451 Restlessness and agitation: Secondary | ICD-10-CM | POA: Diagnosis not present

## 2019-02-18 DIAGNOSIS — G3183 Dementia with Lewy bodies: Secondary | ICD-10-CM | POA: Diagnosis not present

## 2019-02-18 DIAGNOSIS — R634 Abnormal weight loss: Secondary | ICD-10-CM | POA: Diagnosis not present

## 2019-02-19 DIAGNOSIS — R451 Restlessness and agitation: Secondary | ICD-10-CM | POA: Diagnosis not present

## 2019-02-19 DIAGNOSIS — R634 Abnormal weight loss: Secondary | ICD-10-CM | POA: Diagnosis not present

## 2019-02-19 DIAGNOSIS — F0281 Dementia in other diseases classified elsewhere with behavioral disturbance: Secondary | ICD-10-CM | POA: Diagnosis not present

## 2019-02-19 DIAGNOSIS — G3183 Dementia with Lewy bodies: Secondary | ICD-10-CM | POA: Diagnosis not present

## 2019-02-20 DIAGNOSIS — F0281 Dementia in other diseases classified elsewhere with behavioral disturbance: Secondary | ICD-10-CM | POA: Diagnosis not present

## 2019-02-20 DIAGNOSIS — R634 Abnormal weight loss: Secondary | ICD-10-CM | POA: Diagnosis not present

## 2019-02-20 DIAGNOSIS — G3183 Dementia with Lewy bodies: Secondary | ICD-10-CM | POA: Diagnosis not present

## 2019-02-20 DIAGNOSIS — R451 Restlessness and agitation: Secondary | ICD-10-CM | POA: Diagnosis not present

## 2019-02-21 DIAGNOSIS — R451 Restlessness and agitation: Secondary | ICD-10-CM | POA: Diagnosis not present

## 2019-02-21 DIAGNOSIS — G3183 Dementia with Lewy bodies: Secondary | ICD-10-CM | POA: Diagnosis not present

## 2019-02-21 DIAGNOSIS — R634 Abnormal weight loss: Secondary | ICD-10-CM | POA: Diagnosis not present

## 2019-02-21 DIAGNOSIS — F0281 Dementia in other diseases classified elsewhere with behavioral disturbance: Secondary | ICD-10-CM | POA: Diagnosis not present

## 2019-02-22 DIAGNOSIS — R634 Abnormal weight loss: Secondary | ICD-10-CM | POA: Diagnosis not present

## 2019-02-22 DIAGNOSIS — G3183 Dementia with Lewy bodies: Secondary | ICD-10-CM | POA: Diagnosis not present

## 2019-02-22 DIAGNOSIS — R451 Restlessness and agitation: Secondary | ICD-10-CM | POA: Diagnosis not present

## 2019-02-22 DIAGNOSIS — F0281 Dementia in other diseases classified elsewhere with behavioral disturbance: Secondary | ICD-10-CM | POA: Diagnosis not present

## 2019-02-23 DIAGNOSIS — R451 Restlessness and agitation: Secondary | ICD-10-CM | POA: Diagnosis not present

## 2019-02-23 DIAGNOSIS — F0281 Dementia in other diseases classified elsewhere with behavioral disturbance: Secondary | ICD-10-CM | POA: Diagnosis not present

## 2019-02-23 DIAGNOSIS — R634 Abnormal weight loss: Secondary | ICD-10-CM | POA: Diagnosis not present

## 2019-02-23 DIAGNOSIS — G3183 Dementia with Lewy bodies: Secondary | ICD-10-CM | POA: Diagnosis not present

## 2019-02-24 DIAGNOSIS — R451 Restlessness and agitation: Secondary | ICD-10-CM | POA: Diagnosis not present

## 2019-02-24 DIAGNOSIS — F0281 Dementia in other diseases classified elsewhere with behavioral disturbance: Secondary | ICD-10-CM | POA: Diagnosis not present

## 2019-02-24 DIAGNOSIS — G3183 Dementia with Lewy bodies: Secondary | ICD-10-CM | POA: Diagnosis not present

## 2019-02-24 DIAGNOSIS — R634 Abnormal weight loss: Secondary | ICD-10-CM | POA: Diagnosis not present

## 2019-02-25 DIAGNOSIS — F0281 Dementia in other diseases classified elsewhere with behavioral disturbance: Secondary | ICD-10-CM | POA: Diagnosis not present

## 2019-02-25 DIAGNOSIS — R451 Restlessness and agitation: Secondary | ICD-10-CM | POA: Diagnosis not present

## 2019-02-25 DIAGNOSIS — R634 Abnormal weight loss: Secondary | ICD-10-CM | POA: Diagnosis not present

## 2019-02-25 DIAGNOSIS — G3183 Dementia with Lewy bodies: Secondary | ICD-10-CM | POA: Diagnosis not present

## 2019-02-26 DIAGNOSIS — G3183 Dementia with Lewy bodies: Secondary | ICD-10-CM | POA: Diagnosis not present

## 2019-02-26 DIAGNOSIS — R634 Abnormal weight loss: Secondary | ICD-10-CM | POA: Diagnosis not present

## 2019-02-26 DIAGNOSIS — F0281 Dementia in other diseases classified elsewhere with behavioral disturbance: Secondary | ICD-10-CM | POA: Diagnosis not present

## 2019-02-26 DIAGNOSIS — R451 Restlessness and agitation: Secondary | ICD-10-CM | POA: Diagnosis not present

## 2019-02-27 DIAGNOSIS — R451 Restlessness and agitation: Secondary | ICD-10-CM | POA: Diagnosis not present

## 2019-02-27 DIAGNOSIS — G3183 Dementia with Lewy bodies: Secondary | ICD-10-CM | POA: Diagnosis not present

## 2019-02-27 DIAGNOSIS — R634 Abnormal weight loss: Secondary | ICD-10-CM | POA: Diagnosis not present

## 2019-02-27 DIAGNOSIS — F0281 Dementia in other diseases classified elsewhere with behavioral disturbance: Secondary | ICD-10-CM | POA: Diagnosis not present

## 2019-02-28 DIAGNOSIS — R451 Restlessness and agitation: Secondary | ICD-10-CM | POA: Diagnosis not present

## 2019-02-28 DIAGNOSIS — F0281 Dementia in other diseases classified elsewhere with behavioral disturbance: Secondary | ICD-10-CM | POA: Diagnosis not present

## 2019-02-28 DIAGNOSIS — G3183 Dementia with Lewy bodies: Secondary | ICD-10-CM | POA: Diagnosis not present

## 2019-02-28 DIAGNOSIS — R634 Abnormal weight loss: Secondary | ICD-10-CM | POA: Diagnosis not present

## 2019-03-01 DIAGNOSIS — R634 Abnormal weight loss: Secondary | ICD-10-CM | POA: Diagnosis not present

## 2019-03-01 DIAGNOSIS — F0281 Dementia in other diseases classified elsewhere with behavioral disturbance: Secondary | ICD-10-CM | POA: Diagnosis not present

## 2019-03-01 DIAGNOSIS — R451 Restlessness and agitation: Secondary | ICD-10-CM | POA: Diagnosis not present

## 2019-03-01 DIAGNOSIS — G3183 Dementia with Lewy bodies: Secondary | ICD-10-CM | POA: Diagnosis not present

## 2019-03-02 DIAGNOSIS — R634 Abnormal weight loss: Secondary | ICD-10-CM | POA: Diagnosis not present

## 2019-03-02 DIAGNOSIS — R451 Restlessness and agitation: Secondary | ICD-10-CM | POA: Diagnosis not present

## 2019-03-02 DIAGNOSIS — G3183 Dementia with Lewy bodies: Secondary | ICD-10-CM | POA: Diagnosis not present

## 2019-03-02 DIAGNOSIS — F0281 Dementia in other diseases classified elsewhere with behavioral disturbance: Secondary | ICD-10-CM | POA: Diagnosis not present

## 2019-03-03 DIAGNOSIS — F0281 Dementia in other diseases classified elsewhere with behavioral disturbance: Secondary | ICD-10-CM | POA: Diagnosis not present

## 2019-03-03 DIAGNOSIS — G3183 Dementia with Lewy bodies: Secondary | ICD-10-CM | POA: Diagnosis not present

## 2019-03-03 DIAGNOSIS — R451 Restlessness and agitation: Secondary | ICD-10-CM | POA: Diagnosis not present

## 2019-03-03 DIAGNOSIS — R634 Abnormal weight loss: Secondary | ICD-10-CM | POA: Diagnosis not present

## 2019-03-04 DIAGNOSIS — R634 Abnormal weight loss: Secondary | ICD-10-CM | POA: Diagnosis not present

## 2019-03-04 DIAGNOSIS — F0281 Dementia in other diseases classified elsewhere with behavioral disturbance: Secondary | ICD-10-CM | POA: Diagnosis not present

## 2019-03-04 DIAGNOSIS — G3183 Dementia with Lewy bodies: Secondary | ICD-10-CM | POA: Diagnosis not present

## 2019-03-04 DIAGNOSIS — R451 Restlessness and agitation: Secondary | ICD-10-CM | POA: Diagnosis not present

## 2019-03-05 DIAGNOSIS — R451 Restlessness and agitation: Secondary | ICD-10-CM | POA: Diagnosis not present

## 2019-03-05 DIAGNOSIS — F0281 Dementia in other diseases classified elsewhere with behavioral disturbance: Secondary | ICD-10-CM | POA: Diagnosis not present

## 2019-03-05 DIAGNOSIS — G3183 Dementia with Lewy bodies: Secondary | ICD-10-CM | POA: Diagnosis not present

## 2019-03-05 DIAGNOSIS — R634 Abnormal weight loss: Secondary | ICD-10-CM | POA: Diagnosis not present

## 2019-03-06 DIAGNOSIS — R634 Abnormal weight loss: Secondary | ICD-10-CM | POA: Diagnosis not present

## 2019-03-06 DIAGNOSIS — G3183 Dementia with Lewy bodies: Secondary | ICD-10-CM | POA: Diagnosis not present

## 2019-03-06 DIAGNOSIS — F0281 Dementia in other diseases classified elsewhere with behavioral disturbance: Secondary | ICD-10-CM | POA: Diagnosis not present

## 2019-03-06 DIAGNOSIS — R451 Restlessness and agitation: Secondary | ICD-10-CM | POA: Diagnosis not present

## 2019-03-07 DIAGNOSIS — G3183 Dementia with Lewy bodies: Secondary | ICD-10-CM | POA: Diagnosis not present

## 2019-03-07 DIAGNOSIS — R634 Abnormal weight loss: Secondary | ICD-10-CM | POA: Diagnosis not present

## 2019-03-07 DIAGNOSIS — R451 Restlessness and agitation: Secondary | ICD-10-CM | POA: Diagnosis not present

## 2019-03-07 DIAGNOSIS — F0281 Dementia in other diseases classified elsewhere with behavioral disturbance: Secondary | ICD-10-CM | POA: Diagnosis not present

## 2019-03-08 DIAGNOSIS — G3183 Dementia with Lewy bodies: Secondary | ICD-10-CM | POA: Diagnosis not present

## 2019-03-08 DIAGNOSIS — F0390 Unspecified dementia without behavioral disturbance: Secondary | ICD-10-CM | POA: Diagnosis not present

## 2020-04-24 IMAGING — PT NM PET BRAIN AMYLOID
6 series · 25 of 25 positions shown · non-contrast
Comparison: None.

CLINICAL DATA: Altered mental status. Memory loss. 10.16 mCi F-18
FDG RAC IV @ 8611 / SHARONDA ENCEPHALOPATHY, DEMENTIA W/ BEHAVIOR
DISTURBANCE FBG = 92 MG/DL EOV^82.89millicurie FDG FLUDEOXYGLUCOSE
F - 18 (FDG) INJECTIONEncephalopathy FTD vs Alzheimers. FDG PET SCAN
FOR DEMENTIA.

EXAM:
NM PET METABOLIC BRAIN
TECHNIQUE: 10.6 mCi F-18 FDG was injected intravenously via the . Full-ring PET
imaging was performed from the vertex to the skull base. CT data was
obtained and used for attenuation correction and anatomic
localization.

[Series 3: pet brain ac · axial · 3.0mm · 2.04mm/px · z∈[-598,-426]mm · 6 of 87 slices shown]
[im 1/87]
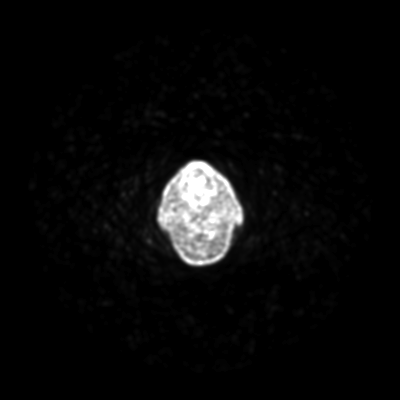
[im 18/87]
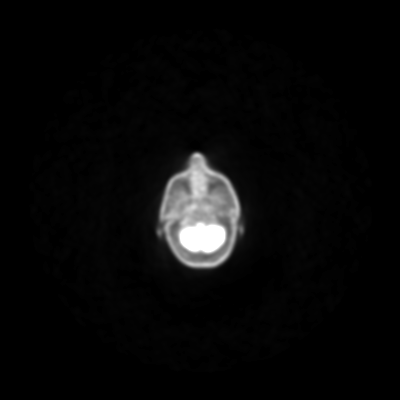
[im 35/87]
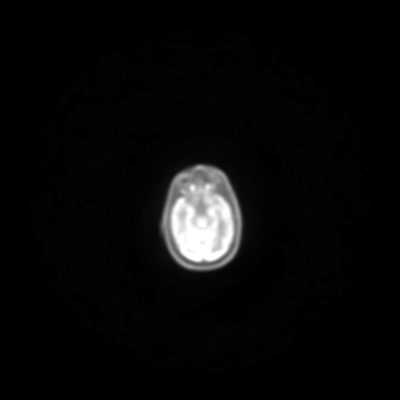
[im 52/87]
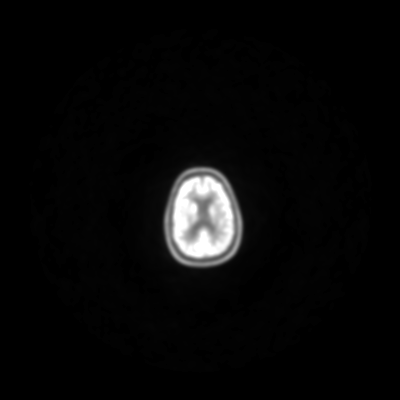
[im 69/87]
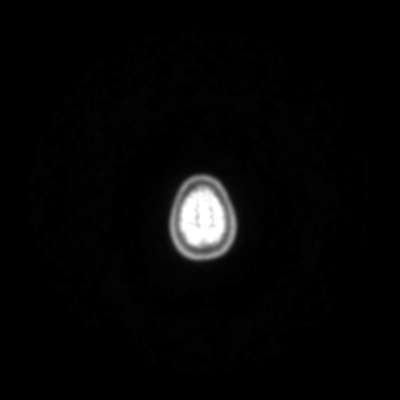
[im 87/87]
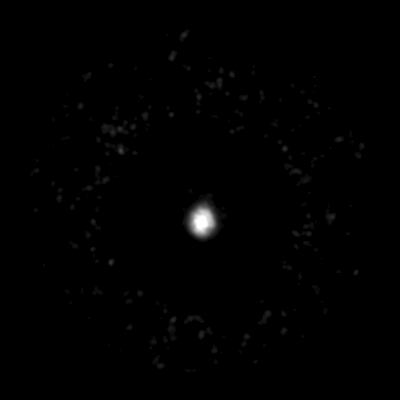

[Series 4: ct brain 3.0 h31s · axial · 3.0mm · 0.53mm/px · z∈[-598,-426]mm · 5 of 87 slices shown]
[im 1/87  brain]
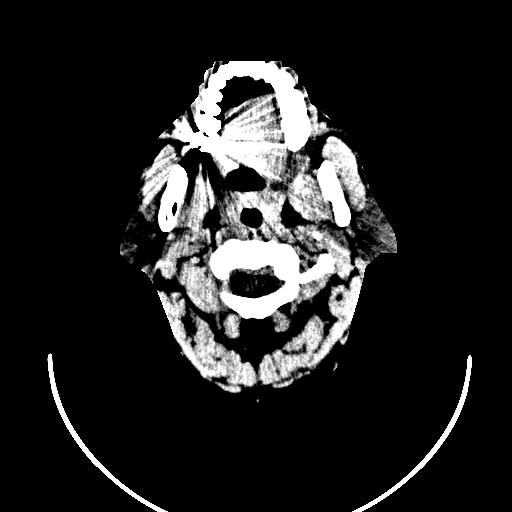
[im 22/87  brain]
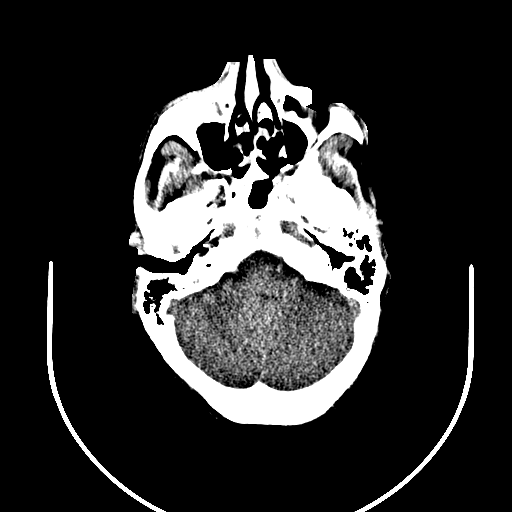
[im 44/87  brain]
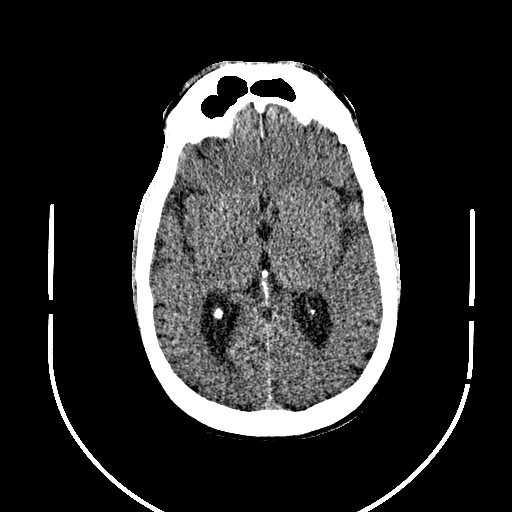
[im 65/87  brain]
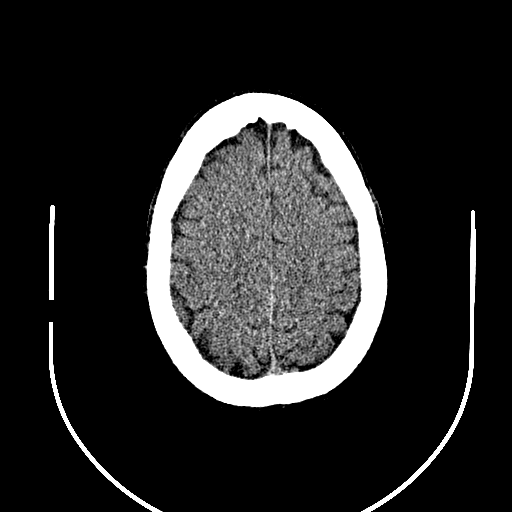
[im 87/87  bone]
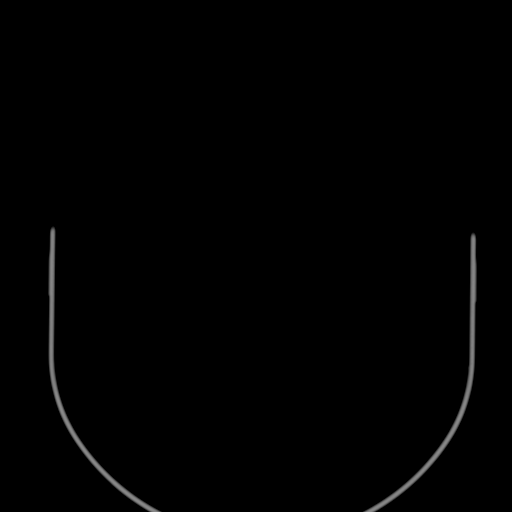

[Series 5: pet brain ac itr · axial · 3.0mm · 4.07mm/px · z∈[-598,-426]mm · 5 of 87 slices shown]
[im 1/87]
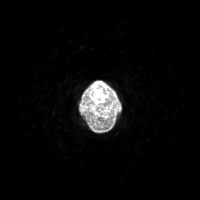
[im 22/87]
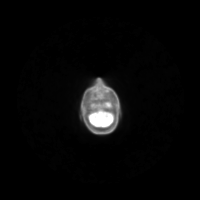
[im 44/87]
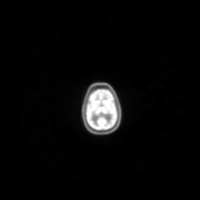
[im 65/87]
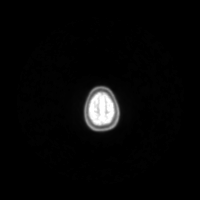
[im 87/87]
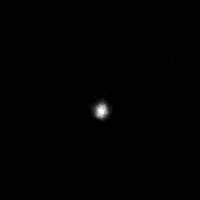

[Series 603: mip range · coronal · 1.68mm/px · 2 of 32 slices shown]
[im 1/32]
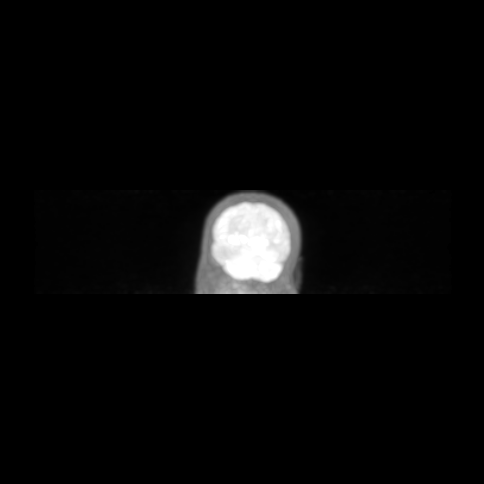
[im 32/32]
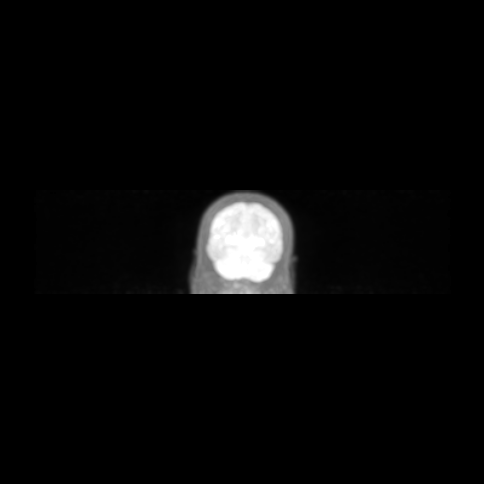

[Series 604: range-ct brain 3.0 (id)<alpha range> · 3 of 41 slices shown]
[im 1/41]
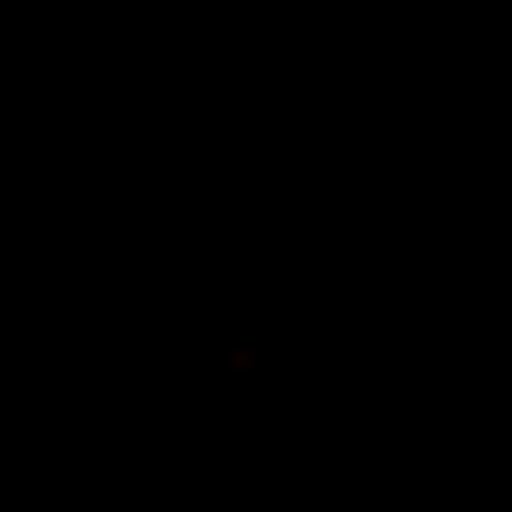
[im 21/41]
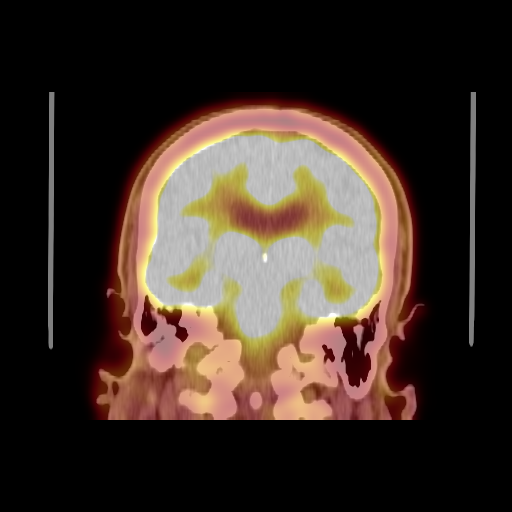
[im 41/41]
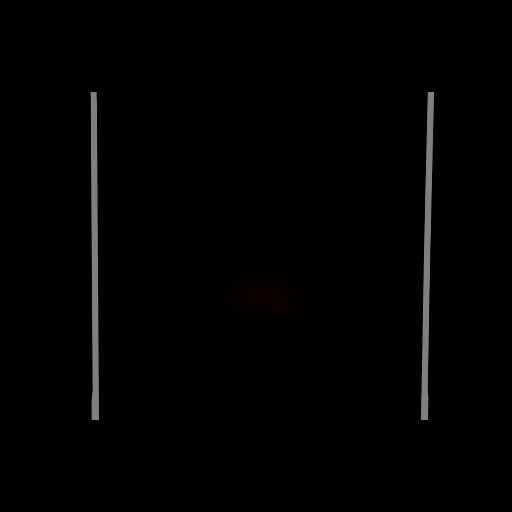

[Series 1062: mpr tra 2mm range · 0.94mm/px · 4 of 66 slices shown]
[im 1/66]
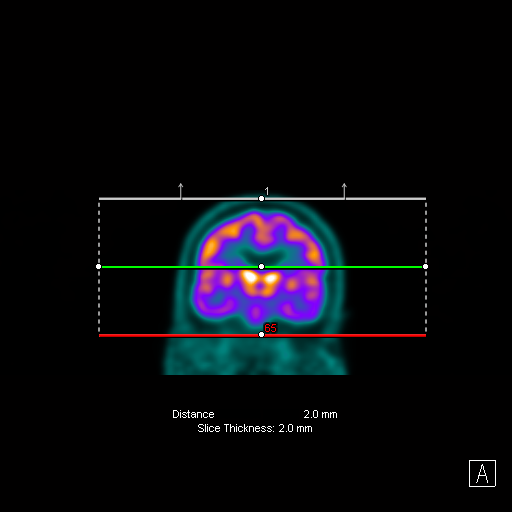
[im 22/66]
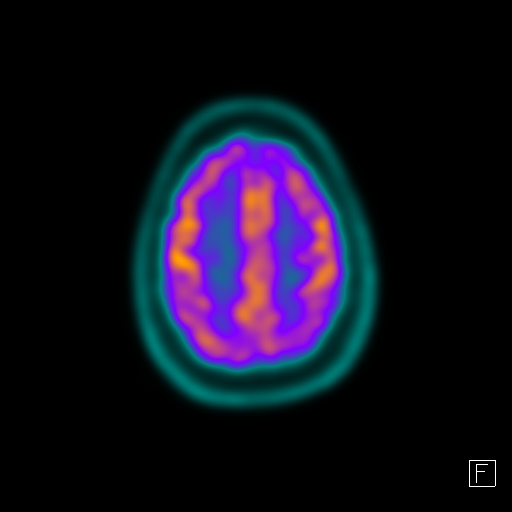
[im 44/66]
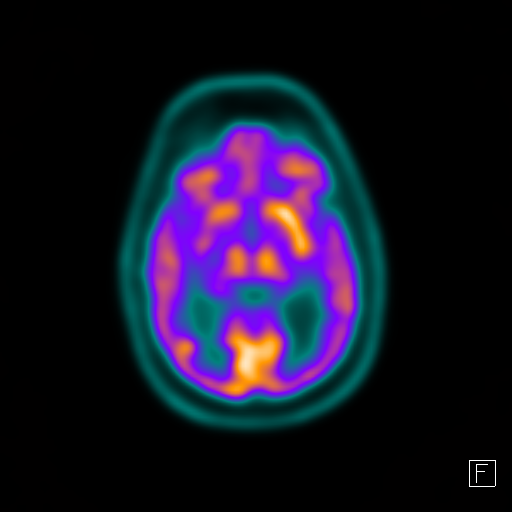
[im 66/66]
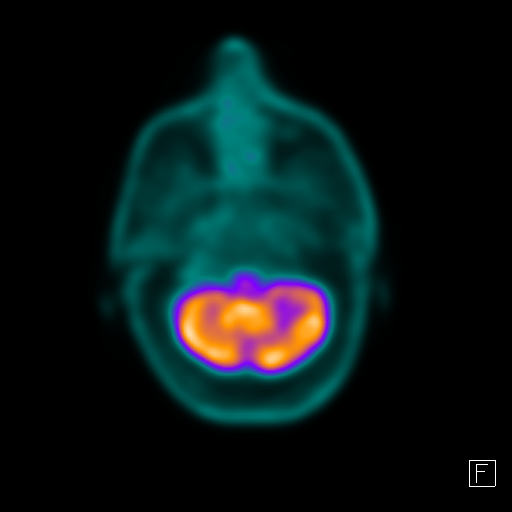

[25 of 25 positions shown; findings below may reference images not displayed]

FINDINGS: No decreased relative cortical activity in the frontal lobes. No
decreased relative cortical metabolism within the occipital lobes.
Normal cortical metabolism within the occipital lobes. Temporal
lobes within normal limits.
IMPRESSION: No decreased relative cortical metabolism to suggest frontotemporal
dementia or Alzheimer's type pathology.

## 2020-12-03 DIAGNOSIS — L600 Ingrowing nail: Secondary | ICD-10-CM | POA: Diagnosis not present

## 2020-12-03 DIAGNOSIS — B351 Tinea unguium: Secondary | ICD-10-CM | POA: Diagnosis not present

## 2020-12-03 DIAGNOSIS — I7091 Generalized atherosclerosis: Secondary | ICD-10-CM | POA: Diagnosis not present

## 2020-12-20 DIAGNOSIS — L89153 Pressure ulcer of sacral region, stage 3: Secondary | ICD-10-CM | POA: Diagnosis not present

## 2020-12-25 DIAGNOSIS — R1312 Dysphagia, oropharyngeal phase: Secondary | ICD-10-CM | POA: Diagnosis not present

## 2020-12-26 DIAGNOSIS — R1312 Dysphagia, oropharyngeal phase: Secondary | ICD-10-CM | POA: Diagnosis not present

## 2020-12-27 DIAGNOSIS — L89153 Pressure ulcer of sacral region, stage 3: Secondary | ICD-10-CM | POA: Diagnosis not present

## 2021-01-04 DIAGNOSIS — L89153 Pressure ulcer of sacral region, stage 3: Secondary | ICD-10-CM | POA: Diagnosis not present

## 2021-01-10 DIAGNOSIS — L89153 Pressure ulcer of sacral region, stage 3: Secondary | ICD-10-CM | POA: Diagnosis not present

## 2021-01-15 DIAGNOSIS — L89153 Pressure ulcer of sacral region, stage 3: Secondary | ICD-10-CM | POA: Diagnosis not present

## 2021-01-22 DIAGNOSIS — L89153 Pressure ulcer of sacral region, stage 3: Secondary | ICD-10-CM | POA: Diagnosis not present

## 2021-01-29 DIAGNOSIS — L89153 Pressure ulcer of sacral region, stage 3: Secondary | ICD-10-CM | POA: Diagnosis not present

## 2021-01-29 DIAGNOSIS — S91101A Unspecified open wound of right great toe without damage to nail, initial encounter: Secondary | ICD-10-CM | POA: Diagnosis not present

## 2021-02-05 DIAGNOSIS — L89153 Pressure ulcer of sacral region, stage 3: Secondary | ICD-10-CM | POA: Diagnosis not present

## 2021-02-05 DIAGNOSIS — S91101D Unspecified open wound of right great toe without damage to nail, subsequent encounter: Secondary | ICD-10-CM | POA: Diagnosis not present

## 2021-02-12 DIAGNOSIS — S91101D Unspecified open wound of right great toe without damage to nail, subsequent encounter: Secondary | ICD-10-CM | POA: Diagnosis not present

## 2021-02-12 DIAGNOSIS — L89153 Pressure ulcer of sacral region, stage 3: Secondary | ICD-10-CM | POA: Diagnosis not present

## 2021-02-19 DIAGNOSIS — L89153 Pressure ulcer of sacral region, stage 3: Secondary | ICD-10-CM | POA: Diagnosis not present

## 2021-02-19 DIAGNOSIS — S91101D Unspecified open wound of right great toe without damage to nail, subsequent encounter: Secondary | ICD-10-CM | POA: Diagnosis not present

## 2021-02-26 DIAGNOSIS — L89153 Pressure ulcer of sacral region, stage 3: Secondary | ICD-10-CM | POA: Diagnosis not present

## 2021-02-26 DIAGNOSIS — S91101D Unspecified open wound of right great toe without damage to nail, subsequent encounter: Secondary | ICD-10-CM | POA: Diagnosis not present

## 2021-03-05 DIAGNOSIS — L89153 Pressure ulcer of sacral region, stage 3: Secondary | ICD-10-CM | POA: Diagnosis not present

## 2021-03-05 DIAGNOSIS — S91101D Unspecified open wound of right great toe without damage to nail, subsequent encounter: Secondary | ICD-10-CM | POA: Diagnosis not present

## 2021-03-12 DIAGNOSIS — S91101D Unspecified open wound of right great toe without damage to nail, subsequent encounter: Secondary | ICD-10-CM | POA: Diagnosis not present

## 2021-03-12 DIAGNOSIS — L89153 Pressure ulcer of sacral region, stage 3: Secondary | ICD-10-CM | POA: Diagnosis not present

## 2021-03-19 DIAGNOSIS — S91101D Unspecified open wound of right great toe without damage to nail, subsequent encounter: Secondary | ICD-10-CM | POA: Diagnosis not present

## 2021-03-19 DIAGNOSIS — L89153 Pressure ulcer of sacral region, stage 3: Secondary | ICD-10-CM | POA: Diagnosis not present

## 2021-04-02 DIAGNOSIS — S91101D Unspecified open wound of right great toe without damage to nail, subsequent encounter: Secondary | ICD-10-CM | POA: Diagnosis not present

## 2021-04-02 DIAGNOSIS — L89154 Pressure ulcer of sacral region, stage 4: Secondary | ICD-10-CM | POA: Diagnosis not present

## 2021-04-05 DIAGNOSIS — R062 Wheezing: Secondary | ICD-10-CM | POA: Diagnosis not present

## 2021-05-02 DIAGNOSIS — R895 Abnormal microbiological findings in specimens from other organs, systems and tissues: Secondary | ICD-10-CM | POA: Diagnosis not present

## 2021-05-08 DIAGNOSIS — L89159 Pressure ulcer of sacral region, unspecified stage: Secondary | ICD-10-CM | POA: Diagnosis not present

## 2021-06-08 DIAGNOSIS — N390 Urinary tract infection, site not specified: Secondary | ICD-10-CM | POA: Diagnosis not present

## 2021-06-08 DIAGNOSIS — I10 Essential (primary) hypertension: Secondary | ICD-10-CM | POA: Diagnosis not present

## 2021-07-16 DIAGNOSIS — L89154 Pressure ulcer of sacral region, stage 4: Secondary | ICD-10-CM | POA: Diagnosis not present

## 2021-07-23 DIAGNOSIS — L89154 Pressure ulcer of sacral region, stage 4: Secondary | ICD-10-CM | POA: Diagnosis not present

## 2021-07-30 DIAGNOSIS — L89154 Pressure ulcer of sacral region, stage 4: Secondary | ICD-10-CM | POA: Diagnosis not present

## 2021-08-06 DIAGNOSIS — L89154 Pressure ulcer of sacral region, stage 4: Secondary | ICD-10-CM | POA: Diagnosis not present

## 2021-08-13 DIAGNOSIS — L89154 Pressure ulcer of sacral region, stage 4: Secondary | ICD-10-CM | POA: Diagnosis not present

## 2021-08-20 DIAGNOSIS — L89154 Pressure ulcer of sacral region, stage 4: Secondary | ICD-10-CM | POA: Diagnosis not present

## 2021-08-27 DIAGNOSIS — L89154 Pressure ulcer of sacral region, stage 4: Secondary | ICD-10-CM | POA: Diagnosis not present

## 2021-09-03 DIAGNOSIS — L89154 Pressure ulcer of sacral region, stage 4: Secondary | ICD-10-CM | POA: Diagnosis not present

## 2021-09-10 DIAGNOSIS — L89154 Pressure ulcer of sacral region, stage 4: Secondary | ICD-10-CM | POA: Diagnosis not present

## 2021-09-17 DIAGNOSIS — L89154 Pressure ulcer of sacral region, stage 4: Secondary | ICD-10-CM | POA: Diagnosis not present

## 2021-09-24 DIAGNOSIS — L89154 Pressure ulcer of sacral region, stage 4: Secondary | ICD-10-CM | POA: Diagnosis not present

## 2021-10-01 DIAGNOSIS — L89154 Pressure ulcer of sacral region, stage 4: Secondary | ICD-10-CM | POA: Diagnosis not present

## 2021-10-15 DIAGNOSIS — L89154 Pressure ulcer of sacral region, stage 4: Secondary | ICD-10-CM | POA: Diagnosis not present

## 2021-10-22 DIAGNOSIS — L89154 Pressure ulcer of sacral region, stage 4: Secondary | ICD-10-CM | POA: Diagnosis not present

## 2021-10-29 DIAGNOSIS — L89154 Pressure ulcer of sacral region, stage 4: Secondary | ICD-10-CM | POA: Diagnosis not present

## 2021-11-05 DIAGNOSIS — L89154 Pressure ulcer of sacral region, stage 4: Secondary | ICD-10-CM | POA: Diagnosis not present

## 2021-11-12 DIAGNOSIS — L89154 Pressure ulcer of sacral region, stage 4: Secondary | ICD-10-CM | POA: Diagnosis not present

## 2021-11-19 DIAGNOSIS — L89154 Pressure ulcer of sacral region, stage 4: Secondary | ICD-10-CM | POA: Diagnosis not present

## 2021-11-26 DIAGNOSIS — L89154 Pressure ulcer of sacral region, stage 4: Secondary | ICD-10-CM | POA: Diagnosis not present

## 2021-12-03 DIAGNOSIS — L89154 Pressure ulcer of sacral region, stage 4: Secondary | ICD-10-CM | POA: Diagnosis not present

## 2021-12-10 DIAGNOSIS — L89154 Pressure ulcer of sacral region, stage 4: Secondary | ICD-10-CM | POA: Diagnosis not present

## 2021-12-26 DIAGNOSIS — J069 Acute upper respiratory infection, unspecified: Secondary | ICD-10-CM | POA: Diagnosis not present

## 2021-12-31 DIAGNOSIS — L89154 Pressure ulcer of sacral region, stage 4: Secondary | ICD-10-CM | POA: Diagnosis not present

## 2022-01-08 DIAGNOSIS — R059 Cough, unspecified: Secondary | ICD-10-CM | POA: Diagnosis not present

## 2022-03-05 DIAGNOSIS — R0989 Other specified symptoms and signs involving the circulatory and respiratory systems: Secondary | ICD-10-CM | POA: Diagnosis not present

## 2022-04-15 DIAGNOSIS — L89154 Pressure ulcer of sacral region, stage 4: Secondary | ICD-10-CM | POA: Diagnosis not present

## 2022-04-22 DIAGNOSIS — L89154 Pressure ulcer of sacral region, stage 4: Secondary | ICD-10-CM | POA: Diagnosis not present

## 2022-04-29 DIAGNOSIS — L89154 Pressure ulcer of sacral region, stage 4: Secondary | ICD-10-CM | POA: Diagnosis not present

## 2022-05-06 DIAGNOSIS — L89154 Pressure ulcer of sacral region, stage 4: Secondary | ICD-10-CM | POA: Diagnosis not present

## 2022-05-13 DIAGNOSIS — L89154 Pressure ulcer of sacral region, stage 4: Secondary | ICD-10-CM | POA: Diagnosis not present

## 2022-05-20 DIAGNOSIS — L89154 Pressure ulcer of sacral region, stage 4: Secondary | ICD-10-CM | POA: Diagnosis not present

## 2022-05-27 DIAGNOSIS — L89154 Pressure ulcer of sacral region, stage 4: Secondary | ICD-10-CM | POA: Diagnosis not present

## 2022-06-03 DIAGNOSIS — L89154 Pressure ulcer of sacral region, stage 4: Secondary | ICD-10-CM | POA: Diagnosis not present

## 2022-06-10 DIAGNOSIS — L89154 Pressure ulcer of sacral region, stage 4: Secondary | ICD-10-CM | POA: Diagnosis not present

## 2022-06-17 DIAGNOSIS — L89154 Pressure ulcer of sacral region, stage 4: Secondary | ICD-10-CM | POA: Diagnosis not present

## 2022-06-24 DIAGNOSIS — L89154 Pressure ulcer of sacral region, stage 4: Secondary | ICD-10-CM | POA: Diagnosis not present

## 2022-07-01 DIAGNOSIS — L89154 Pressure ulcer of sacral region, stage 4: Secondary | ICD-10-CM | POA: Diagnosis not present

## 2022-07-08 DIAGNOSIS — L89154 Pressure ulcer of sacral region, stage 4: Secondary | ICD-10-CM | POA: Diagnosis not present

## 2022-07-15 DIAGNOSIS — L89154 Pressure ulcer of sacral region, stage 4: Secondary | ICD-10-CM | POA: Diagnosis not present

## 2022-07-22 DIAGNOSIS — L89154 Pressure ulcer of sacral region, stage 4: Secondary | ICD-10-CM | POA: Diagnosis not present

## 2022-07-29 DIAGNOSIS — L89154 Pressure ulcer of sacral region, stage 4: Secondary | ICD-10-CM | POA: Diagnosis not present

## 2022-08-05 DIAGNOSIS — L89154 Pressure ulcer of sacral region, stage 4: Secondary | ICD-10-CM | POA: Diagnosis not present

## 2022-08-12 DIAGNOSIS — L89154 Pressure ulcer of sacral region, stage 4: Secondary | ICD-10-CM | POA: Diagnosis not present

## 2022-08-19 DIAGNOSIS — L89154 Pressure ulcer of sacral region, stage 4: Secondary | ICD-10-CM | POA: Diagnosis not present

## 2022-08-26 DIAGNOSIS — L89154 Pressure ulcer of sacral region, stage 4: Secondary | ICD-10-CM | POA: Diagnosis not present

## 2022-09-02 DIAGNOSIS — L89154 Pressure ulcer of sacral region, stage 4: Secondary | ICD-10-CM | POA: Diagnosis not present

## 2022-09-09 DIAGNOSIS — L89154 Pressure ulcer of sacral region, stage 4: Secondary | ICD-10-CM | POA: Diagnosis not present

## 2022-09-10 DIAGNOSIS — R0989 Other specified symptoms and signs involving the circulatory and respiratory systems: Secondary | ICD-10-CM | POA: Diagnosis not present

## 2022-09-16 DIAGNOSIS — L89154 Pressure ulcer of sacral region, stage 4: Secondary | ICD-10-CM | POA: Diagnosis not present

## 2022-09-23 DIAGNOSIS — L89154 Pressure ulcer of sacral region, stage 4: Secondary | ICD-10-CM | POA: Diagnosis not present

## 2022-09-30 DIAGNOSIS — L89154 Pressure ulcer of sacral region, stage 4: Secondary | ICD-10-CM | POA: Diagnosis not present

## 2022-10-07 DIAGNOSIS — L89154 Pressure ulcer of sacral region, stage 4: Secondary | ICD-10-CM | POA: Diagnosis not present

## 2022-10-09 DIAGNOSIS — R0989 Other specified symptoms and signs involving the circulatory and respiratory systems: Secondary | ICD-10-CM | POA: Diagnosis not present

## 2022-10-14 DIAGNOSIS — L89154 Pressure ulcer of sacral region, stage 4: Secondary | ICD-10-CM | POA: Diagnosis not present

## 2022-10-21 DIAGNOSIS — L89154 Pressure ulcer of sacral region, stage 4: Secondary | ICD-10-CM | POA: Diagnosis not present

## 2022-10-28 DIAGNOSIS — L89154 Pressure ulcer of sacral region, stage 4: Secondary | ICD-10-CM | POA: Diagnosis not present

## 2022-11-04 DIAGNOSIS — L89154 Pressure ulcer of sacral region, stage 4: Secondary | ICD-10-CM | POA: Diagnosis not present

## 2022-11-11 DIAGNOSIS — L89154 Pressure ulcer of sacral region, stage 4: Secondary | ICD-10-CM | POA: Diagnosis not present

## 2022-11-18 DIAGNOSIS — L89154 Pressure ulcer of sacral region, stage 4: Secondary | ICD-10-CM | POA: Diagnosis not present

## 2022-11-25 DIAGNOSIS — L89154 Pressure ulcer of sacral region, stage 4: Secondary | ICD-10-CM | POA: Diagnosis not present

## 2022-12-02 DIAGNOSIS — L89154 Pressure ulcer of sacral region, stage 4: Secondary | ICD-10-CM | POA: Diagnosis not present

## 2022-12-03 DIAGNOSIS — J069 Acute upper respiratory infection, unspecified: Secondary | ICD-10-CM | POA: Diagnosis not present

## 2022-12-09 DIAGNOSIS — L89154 Pressure ulcer of sacral region, stage 4: Secondary | ICD-10-CM | POA: Diagnosis not present

## 2022-12-16 DIAGNOSIS — L89154 Pressure ulcer of sacral region, stage 4: Secondary | ICD-10-CM | POA: Diagnosis not present

## 2022-12-23 DIAGNOSIS — L89154 Pressure ulcer of sacral region, stage 4: Secondary | ICD-10-CM | POA: Diagnosis not present

## 2022-12-31 DIAGNOSIS — L89154 Pressure ulcer of sacral region, stage 4: Secondary | ICD-10-CM | POA: Diagnosis not present

## 2023-01-06 DIAGNOSIS — L89154 Pressure ulcer of sacral region, stage 4: Secondary | ICD-10-CM | POA: Diagnosis not present

## 2023-01-13 DIAGNOSIS — L89154 Pressure ulcer of sacral region, stage 4: Secondary | ICD-10-CM | POA: Diagnosis not present

## 2023-01-20 DIAGNOSIS — L89154 Pressure ulcer of sacral region, stage 4: Secondary | ICD-10-CM | POA: Diagnosis not present

## 2023-01-27 DIAGNOSIS — L89154 Pressure ulcer of sacral region, stage 4: Secondary | ICD-10-CM | POA: Diagnosis not present

## 2023-02-03 DIAGNOSIS — L89154 Pressure ulcer of sacral region, stage 4: Secondary | ICD-10-CM | POA: Diagnosis not present

## 2023-02-10 DIAGNOSIS — L89154 Pressure ulcer of sacral region, stage 4: Secondary | ICD-10-CM | POA: Diagnosis not present

## 2023-02-17 DIAGNOSIS — L89154 Pressure ulcer of sacral region, stage 4: Secondary | ICD-10-CM | POA: Diagnosis not present

## 2023-02-24 DIAGNOSIS — L89154 Pressure ulcer of sacral region, stage 4: Secondary | ICD-10-CM | POA: Diagnosis not present

## 2023-03-03 DIAGNOSIS — L89154 Pressure ulcer of sacral region, stage 4: Secondary | ICD-10-CM | POA: Diagnosis not present

## 2023-03-10 DIAGNOSIS — L89154 Pressure ulcer of sacral region, stage 4: Secondary | ICD-10-CM | POA: Diagnosis not present

## 2023-03-17 DIAGNOSIS — L89154 Pressure ulcer of sacral region, stage 4: Secondary | ICD-10-CM | POA: Diagnosis not present

## 2023-03-24 DIAGNOSIS — L89154 Pressure ulcer of sacral region, stage 4: Secondary | ICD-10-CM | POA: Diagnosis not present

## 2023-03-31 DIAGNOSIS — L89154 Pressure ulcer of sacral region, stage 4: Secondary | ICD-10-CM | POA: Diagnosis not present

## 2023-04-07 DIAGNOSIS — L89154 Pressure ulcer of sacral region, stage 4: Secondary | ICD-10-CM | POA: Diagnosis not present

## 2023-04-14 DIAGNOSIS — L89154 Pressure ulcer of sacral region, stage 4: Secondary | ICD-10-CM | POA: Diagnosis not present

## 2023-04-21 DIAGNOSIS — L89154 Pressure ulcer of sacral region, stage 4: Secondary | ICD-10-CM | POA: Diagnosis not present

## 2023-04-28 DIAGNOSIS — L89154 Pressure ulcer of sacral region, stage 4: Secondary | ICD-10-CM | POA: Diagnosis not present

## 2023-05-05 DIAGNOSIS — L89154 Pressure ulcer of sacral region, stage 4: Secondary | ICD-10-CM | POA: Diagnosis not present

## 2023-05-12 DIAGNOSIS — L89154 Pressure ulcer of sacral region, stage 4: Secondary | ICD-10-CM | POA: Diagnosis not present

## 2023-05-19 DIAGNOSIS — L89154 Pressure ulcer of sacral region, stage 4: Secondary | ICD-10-CM | POA: Diagnosis not present

## 2023-06-02 DIAGNOSIS — L89154 Pressure ulcer of sacral region, stage 4: Secondary | ICD-10-CM | POA: Diagnosis not present

## 2023-06-09 DIAGNOSIS — L89154 Pressure ulcer of sacral region, stage 4: Secondary | ICD-10-CM | POA: Diagnosis not present

## 2023-06-09 DIAGNOSIS — R059 Cough, unspecified: Secondary | ICD-10-CM | POA: Diagnosis not present

## 2023-06-10 DIAGNOSIS — R059 Cough, unspecified: Secondary | ICD-10-CM | POA: Diagnosis not present

## 2023-06-16 DIAGNOSIS — L89154 Pressure ulcer of sacral region, stage 4: Secondary | ICD-10-CM | POA: Diagnosis not present

## 2023-06-22 DIAGNOSIS — R059 Cough, unspecified: Secondary | ICD-10-CM | POA: Diagnosis not present

## 2023-06-23 DIAGNOSIS — L89154 Pressure ulcer of sacral region, stage 4: Secondary | ICD-10-CM | POA: Diagnosis not present

## 2023-06-30 DIAGNOSIS — L89154 Pressure ulcer of sacral region, stage 4: Secondary | ICD-10-CM | POA: Diagnosis not present

## 2023-07-07 DIAGNOSIS — L89154 Pressure ulcer of sacral region, stage 4: Secondary | ICD-10-CM | POA: Diagnosis not present

## 2023-07-14 DIAGNOSIS — L89154 Pressure ulcer of sacral region, stage 4: Secondary | ICD-10-CM | POA: Diagnosis not present

## 2023-07-21 DIAGNOSIS — L89154 Pressure ulcer of sacral region, stage 4: Secondary | ICD-10-CM | POA: Diagnosis not present

## 2023-07-28 DIAGNOSIS — L89154 Pressure ulcer of sacral region, stage 4: Secondary | ICD-10-CM | POA: Diagnosis not present

## 2023-08-04 DIAGNOSIS — L89154 Pressure ulcer of sacral region, stage 4: Secondary | ICD-10-CM | POA: Diagnosis not present

## 2023-08-11 DIAGNOSIS — L89154 Pressure ulcer of sacral region, stage 4: Secondary | ICD-10-CM | POA: Diagnosis not present

## 2023-08-18 DIAGNOSIS — L89154 Pressure ulcer of sacral region, stage 4: Secondary | ICD-10-CM | POA: Diagnosis not present

## 2023-08-25 DIAGNOSIS — L89154 Pressure ulcer of sacral region, stage 4: Secondary | ICD-10-CM | POA: Diagnosis not present

## 2023-08-31 DIAGNOSIS — R059 Cough, unspecified: Secondary | ICD-10-CM | POA: Diagnosis not present

## 2023-09-01 DIAGNOSIS — L89154 Pressure ulcer of sacral region, stage 4: Secondary | ICD-10-CM | POA: Diagnosis not present

## 2023-09-08 DIAGNOSIS — L89154 Pressure ulcer of sacral region, stage 4: Secondary | ICD-10-CM | POA: Diagnosis not present

## 2023-09-15 DIAGNOSIS — L89154 Pressure ulcer of sacral region, stage 4: Secondary | ICD-10-CM | POA: Diagnosis not present

## 2023-09-22 DIAGNOSIS — L89154 Pressure ulcer of sacral region, stage 4: Secondary | ICD-10-CM | POA: Diagnosis not present

## 2023-09-29 DIAGNOSIS — L89154 Pressure ulcer of sacral region, stage 4: Secondary | ICD-10-CM | POA: Diagnosis not present

## 2023-10-06 DIAGNOSIS — L89154 Pressure ulcer of sacral region, stage 4: Secondary | ICD-10-CM | POA: Diagnosis not present

## 2023-10-27 DIAGNOSIS — L89154 Pressure ulcer of sacral region, stage 4: Secondary | ICD-10-CM | POA: Diagnosis not present

## 2023-11-03 DIAGNOSIS — L89154 Pressure ulcer of sacral region, stage 4: Secondary | ICD-10-CM | POA: Diagnosis not present

## 2023-12-16 DIAGNOSIS — R059 Cough, unspecified: Secondary | ICD-10-CM | POA: Diagnosis not present
